# Patient Record
Sex: Female | Born: 1935 | Race: White | Hispanic: No | Smoking: Never smoker
Health system: Southern US, Community
[De-identification: ages and names within clinical notes are randomized; demographics above are authoritative.]

## PROBLEM LIST (undated history)

## (undated) DIAGNOSIS — I10 Essential (primary) hypertension: Secondary | ICD-10-CM

## (undated) DIAGNOSIS — E079 Disorder of thyroid, unspecified: Secondary | ICD-10-CM

## (undated) DIAGNOSIS — E785 Hyperlipidemia, unspecified: Secondary | ICD-10-CM

## (undated) DIAGNOSIS — I509 Heart failure, unspecified: Secondary | ICD-10-CM

## (undated) DIAGNOSIS — I4891 Unspecified atrial fibrillation: Secondary | ICD-10-CM

## (undated) DIAGNOSIS — K7460 Unspecified cirrhosis of liver: Secondary | ICD-10-CM

## (undated) DIAGNOSIS — A310 Pulmonary mycobacterial infection: Secondary | ICD-10-CM

## (undated) DIAGNOSIS — R0989 Other specified symptoms and signs involving the circulatory and respiratory systems: Secondary | ICD-10-CM

## (undated) DIAGNOSIS — J40 Bronchitis, not specified as acute or chronic: Secondary | ICD-10-CM

## (undated) DIAGNOSIS — H3530 Unspecified macular degeneration: Secondary | ICD-10-CM

## (undated) HISTORY — PX: ABDOMINAL HYSTERECTOMY: SHX81

## (undated) HISTORY — PX: CORONARY ARTERY BYPASS GRAFT: SHX141

## (undated) HISTORY — PX: VASCULAR SURGERY: SHX849

## (undated) HISTORY — PX: PACEMAKER INSERTION: SHX728

## (undated) HISTORY — DX: Unspecified macular degeneration: H35.30

## (undated) HISTORY — PX: MITRAL VALVE REPLACEMENT: SHX147

## (undated) HISTORY — DX: Unspecified atrial fibrillation: I48.91

## (undated) HISTORY — PX: TRICUSPID VALVE REPLACEMENT: SHX816

## (undated) HISTORY — PX: CHOLECYSTECTOMY: SHX55

---

## 1967-02-10 HISTORY — PX: CHOLECYSTECTOMY: SHX55

## 1983-02-10 HISTORY — PX: ABDOMINAL HYSTERECTOMY: SHX81

## 1996-02-10 HISTORY — PX: MITRAL VALVE REPLACEMENT: SHX147

## 2008-04-23 DIAGNOSIS — D649 Anemia, unspecified: Secondary | ICD-10-CM

## 2008-04-23 DIAGNOSIS — D6949 Other primary thrombocytopenia: Secondary | ICD-10-CM

## 2008-04-23 HISTORY — DX: Anemia, unspecified: D64.9

## 2008-04-23 HISTORY — DX: Other primary thrombocytopenia: D69.49

## 2008-05-26 DIAGNOSIS — R161 Splenomegaly, not elsewhere classified: Secondary | ICD-10-CM

## 2008-05-26 DIAGNOSIS — Z803 Family history of malignant neoplasm of breast: Secondary | ICD-10-CM

## 2008-05-26 HISTORY — DX: Splenomegaly, not elsewhere classified: R16.1

## 2008-05-26 HISTORY — DX: Family history of malignant neoplasm of breast: Z80.3

## 2009-11-05 DIAGNOSIS — I639 Cerebral infarction, unspecified: Secondary | ICD-10-CM

## 2009-11-05 HISTORY — DX: Cerebral infarction, unspecified: I63.9

## 2010-05-16 DIAGNOSIS — R0600 Dyspnea, unspecified: Secondary | ICD-10-CM | POA: Insufficient documentation

## 2010-05-16 DIAGNOSIS — R0609 Other forms of dyspnea: Secondary | ICD-10-CM

## 2010-05-16 HISTORY — DX: Dyspnea, unspecified: R06.00

## 2010-05-16 HISTORY — DX: Other forms of dyspnea: R06.09

## 2012-07-05 DIAGNOSIS — I519 Heart disease, unspecified: Secondary | ICD-10-CM

## 2012-07-05 HISTORY — DX: Heart disease, unspecified: I51.9

## 2012-11-01 DIAGNOSIS — R059 Cough, unspecified: Secondary | ICD-10-CM

## 2012-11-01 HISTORY — DX: Cough, unspecified: R05.9

## 2013-10-30 DIAGNOSIS — Z7901 Long term (current) use of anticoagulants: Secondary | ICD-10-CM

## 2013-10-30 HISTORY — DX: Long term (current) use of anticoagulants: Z79.01

## 2013-11-09 HISTORY — PX: TRICUSPID VALVE SURGERY: SHX817

## 2013-11-15 HISTORY — PX: MITRAL VALVE REPLACEMENT: SHX147

## 2013-12-18 DIAGNOSIS — I721 Aneurysm of artery of upper extremity: Secondary | ICD-10-CM

## 2013-12-18 HISTORY — DX: Aneurysm of artery of upper extremity: I72.1

## 2017-03-09 ENCOUNTER — Observation Stay (HOSPITAL_BASED_OUTPATIENT_CLINIC_OR_DEPARTMENT_OTHER)
Admission: EM | Admit: 2017-03-09 | Discharge: 2017-03-10 | Disposition: A | Discharge status: Home / Self Care | Payer: Medicare Other | Attending: Family Medicine | Admitting: Family Medicine

## 2017-03-09 ENCOUNTER — Encounter (HOSPITAL_BASED_OUTPATIENT_CLINIC_OR_DEPARTMENT_OTHER): Payer: Self-pay | Admitting: *Deleted

## 2017-03-09 ENCOUNTER — Other Ambulatory Visit: Payer: Self-pay

## 2017-03-09 DIAGNOSIS — I482 Chronic atrial fibrillation, unspecified: Secondary | ICD-10-CM | POA: Diagnosis present

## 2017-03-09 DIAGNOSIS — E785 Hyperlipidemia, unspecified: Secondary | ICD-10-CM

## 2017-03-09 DIAGNOSIS — Z950 Presence of cardiac pacemaker: Secondary | ICD-10-CM | POA: Insufficient documentation

## 2017-03-09 DIAGNOSIS — Z79899 Other long term (current) drug therapy: Secondary | ICD-10-CM | POA: Insufficient documentation

## 2017-03-09 DIAGNOSIS — K625 Hemorrhage of anus and rectum: Principal | ICD-10-CM

## 2017-03-09 DIAGNOSIS — I10 Essential (primary) hypertension: Secondary | ICD-10-CM | POA: Diagnosis present

## 2017-03-09 DIAGNOSIS — N183 Chronic kidney disease, stage 3 (moderate): Secondary | ICD-10-CM | POA: Diagnosis not present

## 2017-03-09 DIAGNOSIS — I130 Hypertensive heart and chronic kidney disease with heart failure and stage 1 through stage 4 chronic kidney disease, or unspecified chronic kidney disease: Secondary | ICD-10-CM | POA: Insufficient documentation

## 2017-03-09 DIAGNOSIS — Z952 Presence of prosthetic heart valve: Secondary | ICD-10-CM | POA: Insufficient documentation

## 2017-03-09 DIAGNOSIS — D696 Thrombocytopenia, unspecified: Secondary | ICD-10-CM | POA: Diagnosis not present

## 2017-03-09 DIAGNOSIS — E039 Hypothyroidism, unspecified: Secondary | ICD-10-CM

## 2017-03-09 DIAGNOSIS — K644 Residual hemorrhoidal skin tags: Secondary | ICD-10-CM | POA: Diagnosis not present

## 2017-03-09 DIAGNOSIS — I509 Heart failure, unspecified: Secondary | ICD-10-CM | POA: Insufficient documentation

## 2017-03-09 DIAGNOSIS — I2510 Atherosclerotic heart disease of native coronary artery without angina pectoris: Secondary | ICD-10-CM | POA: Insufficient documentation

## 2017-03-09 DIAGNOSIS — Z7901 Long term (current) use of anticoagulants: Secondary | ICD-10-CM | POA: Insufficient documentation

## 2017-03-09 DIAGNOSIS — Z951 Presence of aortocoronary bypass graft: Secondary | ICD-10-CM | POA: Diagnosis not present

## 2017-03-09 DIAGNOSIS — K7460 Unspecified cirrhosis of liver: Secondary | ICD-10-CM | POA: Diagnosis not present

## 2017-03-09 DIAGNOSIS — D62 Acute posthemorrhagic anemia: Secondary | ICD-10-CM | POA: Diagnosis present

## 2017-03-09 DIAGNOSIS — K922 Gastrointestinal hemorrhage, unspecified: Secondary | ICD-10-CM

## 2017-03-09 HISTORY — DX: Heart failure, unspecified: I50.9

## 2017-03-09 HISTORY — DX: Essential (primary) hypertension: I10

## 2017-03-09 HISTORY — DX: Hyperlipidemia, unspecified: E78.5

## 2017-03-09 HISTORY — DX: Gastrointestinal hemorrhage, unspecified: K92.2

## 2017-03-09 HISTORY — DX: Disorder of thyroid, unspecified: E07.9

## 2017-03-09 HISTORY — DX: Other specified symptoms and signs involving the circulatory and respiratory systems: R09.89

## 2017-03-09 HISTORY — DX: Unspecified cirrhosis of liver: K74.60

## 2017-03-09 HISTORY — DX: Chronic atrial fibrillation, unspecified: I48.20

## 2017-03-09 HISTORY — DX: Acute posthemorrhagic anemia: D62

## 2017-03-09 HISTORY — DX: Bronchitis, not specified as acute or chronic: J40

## 2017-03-09 HISTORY — DX: Pulmonary mycobacterial infection: A31.0

## 2017-03-09 HISTORY — DX: Hemorrhage of anus and rectum: K62.5

## 2017-03-09 HISTORY — DX: Hypothyroidism, unspecified: E03.9

## 2017-03-09 LAB — CBC
HEMATOCRIT: 31.6 % — AB (ref 36.0–46.0)
HEMOGLOBIN: 10.3 g/dL — AB (ref 12.0–15.0)
MCH: 30.2 pg (ref 26.0–34.0)
MCHC: 32.6 g/dL (ref 30.0–36.0)
MCV: 92.7 fL (ref 78.0–100.0)
PLATELETS: 81 10*3/uL — AB (ref 150–400)
RBC: 3.41 MIL/uL — AB (ref 3.87–5.11)
RDW: 13.5 % (ref 11.5–15.5)
WBC: 3.7 10*3/uL — AB (ref 4.0–10.5)

## 2017-03-09 LAB — BASIC METABOLIC PANEL
ANION GAP: 7 (ref 5–15)
BUN: 41 mg/dL — AB (ref 6–20)
CALCIUM: 9.3 mg/dL (ref 8.9–10.3)
CO2: 23 mmol/L (ref 22–32)
CREATININE: 1.88 mg/dL — AB (ref 0.44–1.00)
Chloride: 105 mmol/L (ref 101–111)
GFR calc Af Amer: 28 mL/min — ABNORMAL LOW (ref >60)
GFR, EST NON AFRICAN AMERICAN: 24 mL/min — AB (ref >60)
GLUCOSE: 98 mg/dL (ref 65–99)
Potassium: 5.4 mmol/L — ABNORMAL HIGH (ref 3.5–5.1)
Sodium: 135 mmol/L (ref 135–145)

## 2017-03-09 LAB — PROTIME-INR
INR: 1.74
Prothrombin Time: 20.2 seconds — ABNORMAL HIGH (ref 11.4–15.2)

## 2017-03-09 LAB — CBC WITH DIFFERENTIAL/PLATELET
BASOS PCT: 1 %
Basophils Absolute: 0.1 10*3/uL (ref 0.0–0.1)
Eosinophils Absolute: 0.2 10*3/uL (ref 0.0–0.7)
Eosinophils Relative: 3 %
HEMATOCRIT: 34.1 % — AB (ref 36.0–46.0)
HEMOGLOBIN: 10.9 g/dL — AB (ref 12.0–15.0)
LYMPHS PCT: 14 %
Lymphs Abs: 0.7 10*3/uL (ref 0.7–4.0)
MCH: 30.2 pg (ref 26.0–34.0)
MCHC: 32 g/dL (ref 30.0–36.0)
MCV: 94.5 fL (ref 78.0–100.0)
Monocytes Absolute: 0.7 10*3/uL (ref 0.1–1.0)
Monocytes Relative: 13 %
Neutro Abs: 3.4 10*3/uL (ref 1.7–7.7)
Neutrophils Relative %: 69 %
Platelets: 97 10*3/uL — ABNORMAL LOW (ref 150–400)
RBC: 3.61 MIL/uL — AB (ref 3.87–5.11)
RDW: 13.3 % (ref 11.5–15.5)
WBC: 5.1 10*3/uL (ref 4.0–10.5)

## 2017-03-09 LAB — TYPE AND SCREEN
ABO/RH(D): O NEG
Antibody Screen: NEGATIVE

## 2017-03-09 LAB — ABO/RH: ABO/RH(D): O NEG

## 2017-03-09 MED ORDER — CARVEDILOL 3.125 MG PO TABS: 3.1250 mg | ORAL_TABLET | Freq: Two times a day (BID) | ORAL | Status: DC

## 2017-03-09 MED ORDER — ONDANSETRON HCL 4 MG/2ML IJ SOLN: 4.0000 mg | Freq: Four times a day (QID) | INTRAMUSCULAR | Status: DC | PRN

## 2017-03-09 MED ORDER — ALBUTEROL SULFATE (2.5 MG/3ML) 0.083% IN NEBU: 2.5000 mg | INHALATION_SOLUTION | Freq: Four times a day (QID) | RESPIRATORY_TRACT | Status: DC | PRN

## 2017-03-09 MED ORDER — LEVOTHYROXINE SODIUM 100 MCG PO TABS
200.0000 ug | ORAL_TABLET | Freq: Every day | ORAL | Status: DC
  Administered 2017-03-10: 200 ug via ORAL
  Filled 2017-03-09: qty 2

## 2017-03-09 MED ORDER — IRBESARTAN 150 MG PO TABS: 150.0000 mg | ORAL_TABLET | Freq: Every day | ORAL | Status: DC

## 2017-03-09 MED ORDER — CARVEDILOL 3.125 MG PO TABS
3.1250 mg | ORAL_TABLET | Freq: Two times a day (BID) | ORAL | Status: DC
  Administered 2017-03-09 – 2017-03-10 (×2): 3.125 mg via ORAL
  Filled 2017-03-09 (×2): qty 1

## 2017-03-09 MED ORDER — ALBUTEROL SULFATE HFA 108 (90 BASE) MCG/ACT IN AERS: 1.0000 | INHALATION_SPRAY | Freq: Four times a day (QID) | RESPIRATORY_TRACT | Status: DC | PRN

## 2017-03-09 MED ORDER — ONDANSETRON HCL 4 MG PO TABS: 4.0000 mg | ORAL_TABLET | Freq: Four times a day (QID) | ORAL | Status: DC | PRN

## 2017-03-09 MED ORDER — EZETIMIBE 10 MG PO TABS
10.0000 mg | ORAL_TABLET | Freq: Every day | ORAL | Status: DC
  Administered 2017-03-09 – 2017-03-10 (×2): 10 mg via ORAL
  Filled 2017-03-09 (×2): qty 1

## 2017-03-09 MED ORDER — GEMFIBROZIL 600 MG PO TABS
600.0000 mg | ORAL_TABLET | Freq: Every day | ORAL | Status: DC
  Administered 2017-03-09: 600 mg via ORAL
  Filled 2017-03-09: qty 1

## 2017-03-09 MED ORDER — SODIUM CHLORIDE 0.9 % IV SOLN
INTRAVENOUS | Status: DC
  Administered 2017-03-09: via INTRAVENOUS

## 2017-03-09 MED ORDER — ACETAMINOPHEN 325 MG PO TABS: 650.0000 mg | ORAL_TABLET | Freq: Four times a day (QID) | ORAL | Status: DC | PRN

## 2017-03-09 MED ORDER — VITAMIN B-12 1000 MCG PO TABS
1000.0000 ug | ORAL_TABLET | Freq: Every day | ORAL | Status: DC
  Administered 2017-03-10: 1000 ug via ORAL
  Filled 2017-03-09: qty 1

## 2017-03-09 MED ORDER — GEMFIBROZIL 600 MG PO TABS: 600.0000 mg | ORAL_TABLET | Freq: Every day | ORAL | Status: DC

## 2017-03-09 MED ORDER — EZETIMIBE 10 MG PO TABS: 10.0000 mg | ORAL_TABLET | Freq: Every day | ORAL | Status: DC

## 2017-03-09 MED ORDER — TRAZODONE HCL 100 MG PO TABS
100.0000 mg | ORAL_TABLET | Freq: Every day | ORAL | Status: DC
  Administered 2017-03-09: 100 mg via ORAL
  Filled 2017-03-09: qty 1

## 2017-03-09 MED ORDER — ACETAMINOPHEN 650 MG RE SUPP: 650.0000 mg | Freq: Four times a day (QID) | RECTAL | Status: DC | PRN

## 2017-03-09 MED ORDER — IRBESARTAN 150 MG PO TABS
150.0000 mg | ORAL_TABLET | Freq: Every day | ORAL | Status: DC
  Administered 2017-03-09 – 2017-03-10 (×2): 150 mg via ORAL
  Filled 2017-03-09 (×2): qty 1

## 2017-03-09 NOTE — ED Notes (Signed)
Report attempted x2 to [phone] and x1 to [phone] with no answer

## 2017-03-09 NOTE — Progress Notes (Signed)
Dr. Hal Hope up to see pt at this time and verbally ordered NS @50mL /hr and a STAT CBC. This RN placed above orders.

## 2017-03-09 NOTE — ED Triage Notes (Signed)
Bright red rectal bleeding sudden onset. She takes Coumadin.

## 2017-03-09 NOTE — ED Provider Notes (Addendum)
[facility] EMERGENCY DEPARTMENT Provider Note   CSN: [id number] Arrival date & time: 03/09/17  1205     History   Chief Complaint Chief Complaint  Patient presents with  . Rectal Bleeding    HPI Annette Powers is a 82 y.o. female.  Patient is a 82 year old female who presents with rectal bleeding.  She has a history of CHF, hyperlipidemia, hypertension and atrial fibrillation.  She is on Coumadin.  She has had prior valve replacements of the mitral valve and tricuspid valve.  She has a pacemaker in place.  She states that this morning she was going to the bathroom and when she stood up she had blood all over the floor.  She noticed blood in the toilet.  She has blood when she wipes her bottom.  She has no known history of hemorrhoids.  No rectal pain.  No abdominal pain.  No nausea or vomiting.  No dizziness.  No history of GI bleeding in the past.  She is currently visiting from out of town.  She came straight here following the bleeding.      Past Medical History:  Diagnosis Date  . Abdominal bruit   . Bronchitis   . CHF (congestive heart failure) ([place])   . Hyperlipidemia   . Hypertension   . Liver cirrhosis ([place])   . MAI (mycobacterium avium-intracellulare) ([place])   . Thyroid disease     There are no active problems to display for this patient.   Past Surgical History:  Procedure Laterality Date  . ABDOMINAL HYSTERECTOMY    . CHOLECYSTECTOMY    . MITRAL VALVE REPLACEMENT    . PACEMAKER INSERTION    . TRICUSPID VALVE REPLACEMENT      OB History    No data available       Home Medications    Prior to Admission medications   Medication Sig Start Date End Date Taking? Authorizing Provider  ALBUTEROL IN Inhale into the lungs.   Yes [provider]  Carvedilol (COREG PO) Take by mouth.   Yes [provider]  Cholestyramine (QUESTRAN PO) Take by mouth.   Yes [provider]  ezetimibe (ZETIA) 10 MG tablet Take 10 mg by  mouth daily.   Yes [provider]  Furosemide (LASIX PO) Take by mouth.   Yes [provider]  gemfibrozil (LOPID) 600 MG tablet Take 600 mg by mouth 2 (two) times daily before a meal.   Yes [provider]  Irbesartan (AVAPRO PO) Take by mouth.   Yes [provider]  Levothyroxine Sodium (SYNTHROID PO) Take by mouth.   Yes [provider]  Spironolactone (ALDACTONE PO) Take by mouth.   Yes [provider]  traZODone (DESYREL) 100 MG tablet Take 100 mg by mouth at bedtime.   Yes [provider]  warfarin (COUMADIN) 5 MG tablet Take 5 mg by mouth daily.   Yes [provider]    Family History No family history on file.  Social History Social History   Tobacco Use  . Smoking status: Never Smoker  . Smokeless tobacco: Never Used  Substance Use Topics  . Alcohol use: Yes    Frequency: Never  . Drug use: No     Allergies   Patient has no known allergies.   Review of Systems Review of Systems  Constitutional: Negative for chills, diaphoresis, fatigue and fever.  HENT: Negative for congestion, rhinorrhea and sneezing.   Eyes: Negative.   Respiratory: Negative for cough,  chest tightness and shortness of breath.   Cardiovascular: Negative for chest pain and leg swelling.  Gastrointestinal: Positive for blood in stool. Negative for abdominal pain, diarrhea, nausea and vomiting.  Genitourinary: Negative for difficulty urinating, flank pain, frequency and hematuria.  Musculoskeletal: Negative for arthralgias and back pain.  Skin: Negative for rash.  Neurological: Negative for dizziness, speech difficulty, weakness, numbness and headaches.     Physical Exam Updated Vital Signs BP 119/60 (BP Location: Left Arm)   Pulse 61   Temp 97.8 F (36.6 C) (Oral)   Resp 16   Ht 5\' 7"  (1.702 m)   Wt 83.9 kg (185 lb)   SpO2 100%   BMI 28.98 kg/m   Physical Exam  Constitutional: She is oriented to person, place,  and time. She appears well-developed and well-nourished.  HENT:  Head: Normocephalic and atraumatic.  Eyes: Pupils are equal, round, and reactive to light.  Neck: Normal range of motion. Neck supple.  Cardiovascular: Normal rate, regular rhythm and normal heart sounds.  Pulmonary/Chest: Effort normal and breath sounds normal. No respiratory distress. She has no wheezes. She has no rales. She exhibits no tenderness.  Abdominal: Soft. Bowel sounds are normal. There is no tenderness. There is no rebound and no guarding.  Genitourinary:  Genitourinary Comments: Positive bright red blood per rectum.  There are some nonthrombosed hemorrhoids externally.  I do not see any that are actively bleeding.  There is some ecchymosis on 1 of the hemorrhoids.  Musculoskeletal: Normal range of motion. She exhibits no edema.  Lymphadenopathy:    She has no cervical adenopathy.  Neurological: She is alert and oriented to person, place, and time.  Skin: Skin is warm and dry. No rash noted.  Psychiatric: She has a normal mood and affect.     ED Treatments / Results  Labs (all labs ordered are listed, but only abnormal results are displayed) Labs Reviewed  BASIC METABOLIC PANEL - Abnormal; Notable for the following components:      Result Value   Potassium 5.4 (*)    BUN 41 (*)    Creatinine, Ser 1.88 (*)    GFR calc non Af Amer 24 (*)    GFR calc Af Amer 28 (*)    All other components within normal limits  CBC WITH DIFFERENTIAL/PLATELET - Abnormal; Notable for the following components:   RBC 3.61 (*)    Hemoglobin 10.9 (*)    HCT 34.1 (*)    Platelets 97 (*)    All other components within normal limits  PROTIME-INR - Abnormal; Notable for the following components:   Prothrombin Time 20.2 (*)    All other components within normal limits    EKG  EKG Interpretation  Date/Time:  Tuesday March 09 2017 14:53:35 EST Ventricular Rate:  60 PR Interval:    QRS Duration: 168 QT Interval:  461 QTC  Calculation: 461 R Axis:   -65 Text Interpretation:  Junctional rhythm Left bundle branch block Baseline wander in lead(s) V3 No old tracing to compare Confirmed by Malvin Johns [phone]) on 03/09/2017 2:57:02 PM       Radiology No results found.  Procedures Procedures (including critical care time)  Medications Ordered in ED Medications - No data to display   Initial Impression / Assessment and Plan / ED Course  I have reviewed the triage vital signs and the nursing notes.  Pertinent labs & imaging results that were available during my care of the patient were reviewed by me and considered  in my medical decision making (see chart for details).     Patient is a 82 year old female on Coumadin who presents with rectal bleeding.  She has no ongoing bleeding currently.  Her rectal exam did show positive bright red blood.  Her hemoglobin is 10.9 but I do not know what her baseline value is.  She does say that she has chronic thrombocytopenia.  She has a mild elevation in her creatinine.  Her INR is subtherapeutic.  I spoke with Dr.Gherghe who has accepted the patient to [facility].  Dr. Cruzita Lederer states that he will put a bed request in.  Final Clinical Impressions(s) / ED Diagnoses   Final diagnoses:  Rectal bleeding    ED Discharge Orders    None       Malvin Johns, MD 03/09/17 1512    Malvin Johns, MD 03/09/17 [phone]

## 2017-03-09 NOTE — H&P (Signed)
History and Physical    Annette Powers TIW:[id number] DOB: 1935-08-26 DOA: 03/09/2017  PCP: Patient, No Pcp Per  Patient coming from: Home.  Chief Complaint: Rectal bleeding.  HPI: Annette Powers is a 82 y.o. female with history of atrial fibrillation on Coumadin, CAD status post CABG, history of mitral valve and aortic valve status post bioprosthetic valve replacement, history of liver disease and chronic kidney disease, hypothyroidism who is visiting [place] from [place] started experiencing rectal bleeding while she was at the [facility].  Patient states she had a bowel movement and found that the commode was filled with frank rectal bleeding.  Denies any abdominal pain.  Denies any nausea vomiting.  Patient takes Coumadin for atrial fibrillation.  ED Course: In the ER patient is found to be hemodynamically stable.  Hemoglobin is around 10.  Patient states since her first episode she had couple more episodes which were frankly bloody.  Patient will be admitted for further observation.  Patient states she had a colonoscopy done 2 years ago which was unremarkable.  Review of Systems: As per HPI, rest all negative.   Past Medical History:  Diagnosis Date  . Abdominal bruit   . Bronchitis   . CHF (congestive heart failure) ([place])   . Hyperlipidemia   . Hypertension   . Liver cirrhosis ([place])   . MAI (mycobacterium avium-intracellulare) ([place])   . Thyroid disease     Past Surgical History:  Procedure Laterality Date  . ABDOMINAL HYSTERECTOMY    . CHOLECYSTECTOMY    . MITRAL VALVE REPLACEMENT    . PACEMAKER INSERTION    . TRICUSPID VALVE REPLACEMENT       reports that  has never smoked. she has never used smokeless tobacco. She reports that she drinks alcohol. She reports that she does not use drugs.  No Known Allergies  Family History - Mother Breast Cancer.  Prior to Admission medications   Medication Sig Start Date End Date Taking? Authorizing Provider  albuterol  (PROVENTIL HFA;VENTOLIN HFA) 108 (90 Base) MCG/ACT inhaler Inhale 1 puff into the lungs every 6 (six) hours as needed for wheezing or shortness of breath.   Yes [provider]  carvedilol (COREG) 3.125 MG tablet Take 3.125 mg by mouth 2 (two) times daily with a meal.    Yes [provider]  Cholecalciferol 5000 units TABS Take 5,000 Units by mouth daily.   Yes [provider]  cholestyramine Lucrezia Starch) 4 GM/DOSE powder Take 4 g by mouth as needed.    Yes [provider]  ezetimibe (ZETIA) 10 MG tablet Take 10 mg by mouth daily.   Yes [provider]  furosemide (LASIX) 20 MG tablet Take 20 mg by mouth daily.    Yes [provider]  gemfibrozil (LOPID) 600 MG tablet Take 600 mg by mouth at bedtime.    Yes [provider]  irbesartan (AVAPRO) 150 MG tablet Take 150 mg by mouth.    Yes [provider]  levothyroxine (SYNTHROID) 200 MCG tablet Take 200 mcg by mouth daily.    Yes [provider]  spironolactone (ALDACTONE) 50 MG tablet Take 50 mg by mouth daily.    Yes [provider]  traZODone (DESYREL) 100 MG tablet Take 100 mg by mouth at bedtime.   Yes [provider]  vitamin B-12 (CYANOCOBALAMIN) 1000 MCG tablet Take 1,000 mcg by mouth daily.   Yes [provider]  warfarin (COUMADIN) 5 MG tablet Take 5 mg by mouth daily.  Yes [provider]    Physical Exam: Vitals:   03/09/17 1503 03/09/17 1530 03/09/17 1600 03/09/17 1700  BP: 119/60 (!) 118/50 (!) 130/46 (!) 120/49  Pulse: 61 60 63 (!) 59  Resp: 16 18 15    Temp:      TempSrc:      SpO2: 100% 99% 97% 96%  Weight:      Height:          Constitutional: Moderately built and nourished. Vitals:   03/09/17 1503 03/09/17 1530 03/09/17 1600 03/09/17 1700  BP: 119/60 (!) 118/50 (!) 130/46 (!) 120/49  Pulse: 61 60 63 (!) 59  Resp: 16 18 15    Temp:      TempSrc:      SpO2: 100% 99% 97% 96%  Weight:      Height:         Eyes: Anicteric no pallor. ENMT: No discharge from the ears eyes nose or mouth. Neck: No mass felt.  No neck rigidity. Respiratory: No rhonchi or crepitations. Cardiovascular: S1-S2 heard no murmurs appreciated. Abdomen: Soft nontender bowel sounds present. Musculoskeletal: No edema.  No joint effusion. Skin: No rash.  Skin appears warm. Neurologic: Alert awake oriented to time place and person.  Moves all extremities. Psychiatric: Appears normal.  Normal affect.   Labs on Admission: I have personally reviewed following labs and imaging studies  CBC: Recent Labs  Lab 03/09/17 1226 03/09/17 2046  WBC 5.1 3.7*  NEUTROABS 3.4  --   HGB 10.9* 10.3*  HCT 34.1* 31.6*  MCV 94.5 92.7  PLT 97* 81*   Basic Metabolic Panel: Recent Labs  Lab 03/09/17 1226  NA 135  K 5.4*  CL 105  CO2 23  GLUCOSE 98  BUN 41*  CREATININE 1.88*  CALCIUM 9.3   GFR: Estimated Creatinine Clearance: 27 mL/min (A) (by C-G formula based on SCr of 1.88 mg/dL (H)). Liver Function Tests: No results for input(s): AST, ALT, ALKPHOS, BILITOT, PROT, ALBUMIN in the last 168 hours. No results for input(s): LIPASE, AMYLASE in the last 168 hours. No results for input(s): AMMONIA in the last 168 hours. Coagulation Profile: Recent Labs  Lab 03/09/17 1226  INR 1.74   Cardiac Enzymes: No results for input(s): CKTOTAL, CKMB, CKMBINDEX, TROPONINI in the last 168 hours. BNP (last 3 results) No results for input(s): PROBNP in the last 8760 hours. HbA1C: No results for input(s): HGBA1C in the last 72 hours. CBG: No results for input(s): GLUCAP in the last 168 hours. Lipid Profile: No results for input(s): CHOL, HDL, LDLCALC, TRIG, CHOLHDL, LDLDIRECT in the last 72 hours. Thyroid Function Tests: No results for input(s): TSH, T4TOTAL, FREET4, T3FREE, THYROIDAB in the last 72 hours. Anemia Panel: No results for input(s): VITAMINB12, FOLATE, FERRITIN, TIBC, IRON, RETICCTPCT in the last 72 hours. Urine  analysis: No results found for: COLORURINE, APPEARANCEUR, LABSPEC, PHURINE, GLUCOSEU, HGBUR, BILIRUBINUR, KETONESUR, PROTEINUR, UROBILINOGEN, NITRITE, LEUKOCYTESUR Sepsis Labs: @LABRCNTIP (procalcitonin:4,lacticidven:4) )No results found for this or any previous visit (from the past 240 hour(s)).   Radiological Exams on Admission: No results found.   Assessment/Plan Principal Problem:   Rectal bleeding Active Problems:   Atrial fibrillation, chronic (HCC)   Essential hypertension   HLD (hyperlipidemia)   Acute blood loss anemia   Hypothyroidism    1. Rectal bleeding -could be from diverticular disease.  Patient states she had a colonoscopy in [place] 2 years ago which was unremarkable.  For now will hold Coumadin patient agrees to holding Coumadin.  Follow CBC.  Will  keep patient n.p.o and gently hydrate. 2. History of atrial fibrillation presently rate controlled.  On Coreg.  Holding Coumadin due to GI bleed. 3. History of CAD status post CABG -denies any chest pain.  Patient is on Lopid Zetia and Coreg.  Holding Coumadin due to GI bleed. 4. Anemia could be from blood loss or from chronic kidney disease -follow CBC. 5. History of chronic kidney disease stage III -no old labs to compare follow metabolic panel.  Patient's potassium is slightly elevated.  May have to hold ARB if still elevated. 6. Hypertension on Coreg and ARB.  See #5 is regarding to ARB. 7. Hyperlipidemia on Lopid and Zetia. 8. Hypothyroidism on Synthroid. 9. History of bioprosthetic valve replacement for mitral valve and aortic valve. 10. History of cirrhosis of liver. 11. History of pacemaker insertion.   DVT prophylaxis: SCDs. Code Status: Full code. Family Communication: Discussed with patient. Disposition Plan: Home. Consults called: None. Admission status: Observation.   Rise Patience MD [facility] Pager [phone].  If 7PM-7AM, please contact  night-coverage [email or web address] Password [email or web address]  03/09/2017, 9:50 PM

## 2017-03-09 NOTE — Progress Notes (Signed)
Called by Dr. Tamera Punt regarding Annette Powers, 82 year old female who is visiting from out of town, with reported history of A. fib on Coumadin, hypertension, hypothyroidism, chronic thrombocytopenia, external hemorrhoids, who presented to the med Center at [facility] with rectal bleed.  Per EDP, rectal bleed is now almost resolved, her hemoglobin is stable, her vital signs are stable and she is not tachycardic nor hypotensive.  Her INR was 1.7.  Her hemoglobin is 10.9.  Creatinine is 1.88, unknown baseline.  Patient accepted to MedSurg, bleed possibly from diverticular source versus hemorrhoids, it appears stable now and given the fact that she is on Coumadin agree with observation at least overnight.  May need to consider GI involvement if hemoglobin continues to further trend down or she is to actively have bleeding while hospitalized.    RN, on patient arrival, please call [phone] and let patient placement RN know of the patient's arrival and a hospitalist will be assigned to admit the patient.    Kinsleigh Ludolph M. Cruzita Lederer, MD [facility] [phone]  If 7PM-7AM, please contact night-coverage [email or web address] Password [facility]  03/20/2016, 5:37 AM

## 2017-03-09 NOTE — ED Notes (Signed)
ED Provider at bedside. 

## 2017-03-10 DIAGNOSIS — K625 Hemorrhage of anus and rectum: Secondary | ICD-10-CM

## 2017-03-10 DIAGNOSIS — E039 Hypothyroidism, unspecified: Secondary | ICD-10-CM

## 2017-03-10 DIAGNOSIS — D62 Acute posthemorrhagic anemia: Secondary | ICD-10-CM

## 2017-03-10 DIAGNOSIS — I10 Essential (primary) hypertension: Secondary | ICD-10-CM

## 2017-03-10 DIAGNOSIS — I482 Chronic atrial fibrillation: Secondary | ICD-10-CM

## 2017-03-10 LAB — CBC
HCT: 29 % — ABNORMAL LOW (ref 36.0–46.0)
HEMATOCRIT: 30.3 % — AB (ref 36.0–46.0)
Hemoglobin: 9.4 g/dL — ABNORMAL LOW (ref 12.0–15.0)
Hemoglobin: 9.8 g/dL — ABNORMAL LOW (ref 12.0–15.0)
MCH: 30.1 pg (ref 26.0–34.0)
MCH: 30.4 pg (ref 26.0–34.0)
MCHC: 32.3 g/dL (ref 30.0–36.0)
MCHC: 32.4 g/dL (ref 30.0–36.0)
MCV: 92.9 fL (ref 78.0–100.0)
MCV: 93.9 fL (ref 78.0–100.0)
Platelets: 79 10*3/uL — ABNORMAL LOW (ref 150–400)
Platelets: 93 10*3/uL — ABNORMAL LOW (ref 150–400)
RBC: 3.09 MIL/uL — ABNORMAL LOW (ref 3.87–5.11)
RBC: 3.26 MIL/uL — ABNORMAL LOW (ref 3.87–5.11)
RDW: 13.6 % (ref 11.5–15.5)
RDW: 13.7 % (ref 11.5–15.5)
WBC: 3.6 10*3/uL — ABNORMAL LOW (ref 4.0–10.5)
WBC: 4.1 10*3/uL (ref 4.0–10.5)

## 2017-03-10 LAB — BASIC METABOLIC PANEL
Anion gap: 7 (ref 5–15)
BUN: 38 mg/dL — AB (ref 6–20)
CHLORIDE: 108 mmol/L (ref 101–111)
CO2: 22 mmol/L (ref 22–32)
Calcium: 9 mg/dL (ref 8.9–10.3)
Creatinine, Ser: 1.88 mg/dL — ABNORMAL HIGH (ref 0.44–1.00)
GFR calc Af Amer: 28 mL/min — ABNORMAL LOW (ref >60)
GFR calc non Af Amer: 24 mL/min — ABNORMAL LOW (ref >60)
GLUCOSE: 89 mg/dL (ref 65–99)
POTASSIUM: 4.6 mmol/L (ref 3.5–5.1)
Sodium: 137 mmol/L (ref 135–145)

## 2017-03-10 LAB — PROTIME-INR
INR: 1.9
Prothrombin Time: 21.7 seconds — ABNORMAL HIGH (ref 11.4–15.2)

## 2017-03-10 LAB — GLUCOSE, CAPILLARY: Glucose-Capillary: 82 mg/dL (ref 65–99)

## 2017-03-10 MED ORDER — PHYTONADIONE 5 MG PO TABS
5.0000 mg | ORAL_TABLET | Freq: Once | ORAL | Status: AC
  Administered 2017-03-10: 5 mg via ORAL
  Filled 2017-03-10: qty 1

## 2017-03-10 NOTE — Discharge Summary (Signed)
Physician Discharge Summary  Annette Powers [id number]:[id number] DOB: Nov 05, 1935 DOA: 03/09/2017  PCP: Annette Powers  Admit date: 03/09/2017 Discharge date: 03/10/2017  Admitted From: Home  Disposition:  Home   Recommendations for Outpatient Follow-up:  1. Follow up with PCP in 1-2 weeks 2. 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     1. Hematochezia Patient is visiting from [place], was at the [facility]te [facility] with her son, felt urge to defecate, in the toilet saw large volume bright red blood.  Went to UC and was sent to the ER, initial Hgb 10.9 g/dL, no baseline available.  Overnight, Hgb minimal change, down to 9.4 g/dL.  No further episodes of rectal bleeding.  Heart rate stable. This morning, several brown stools.  Given resolution of episode, stable Hgb, classic diverticular character, recent (2 years ago) normal colonoscopy, endoscopy was deferred to her PCP and hepatologist.     2. Atrial fibrillation, valvular INR trended up this morning.  Vitamin K given 5 mg PO once.  Hold warfarin.           Discharge Diagnoses:  Principal Problem:   Rectal bleeding Active Problems:   Atrial fibrillation, chronic (HCC)   Essential hypertension   HLD (hyperlipidemia)   Acute blood loss anemia   Hypothyroidism    Discharge Instructions  Discharge Instructions    Diet - low sodium heart healthy   Complete by:  As directed    Discharge instructions   Complete by:  As directed    From Dr. Loleta Books: You were admitted for rectal bleeding. Cases like yours are most frequently what we call "diverticular bleeding".  Diverticuli are small  outpouchings in the colon which are harmless in and of themselves, unless they were to bleed (or also get infected, although we aren't concerned about that in your case).  Diverticular bleeds stop by themselves.  There is nothing that can be done to stop or slow them other than stopping your warfarin for a time, until the diverticulum heals, then you should restart your warfarin.  STOP WARFARIN for two weeks. Restart your previous dose of warfarin in 2 weeks, after discussion with your primary care doctor or liver doctors.  You should call them today to schedule a follow up appointment as soon as you get home.  Another bloody bowel movement tonight is possible, but if you have more than one more bloody bowel movement or if you have ANY of the following symptoms: dizziness, weakness, feeling out of breath with exertion, passing out, you should be seen to have your blood level checked.  Your hemoglobin (blood level) was 10.9 g/dL when you got here and 9.4 g/dL when you left.  You should take iron sulfate 325 mg every other day for the next two weeks, and continue if your primary care doctor recommends.  Bring this paperwork to your primary care.   Increase activity slowly   Complete by:  As directed      Allergies as of 03/10/2017   No Known Allergies     Medication List    STOP taking these medications   warfarin 5 MG tablet Commonly known as:  COUMADIN     TAKE these medications   albuterol 108 (90 Base) MCG/ACT inhaler Commonly known as:  PROVENTIL HFA;VENTOLIN HFA Inhale 1 puff into the lungs every 6 (six) hours as needed for wheezing or shortness of breath.   ALDACTONE 50 MG tablet Generic drug:  spironolactone Take 50 mg by mouth daily.   AVAPRO 150 MG tablet Generic drug:  irbesartan Take 150 mg by mouth.   Cholecalciferol 5000 units Tabs Take 5,000 Units by mouth daily.   COREG 3.125 MG tablet Generic drug:  carvedilol Take 3.125 mg by mouth 2 (two) times daily with a  meal.   ezetimibe 10 MG tablet Commonly known as:  ZETIA Take 10 mg by mouth daily.   gemfibrozil 600 MG tablet Commonly known as:  LOPID Take 600 mg by mouth at bedtime.   LASIX 20 MG tablet Generic drug:  furosemide Take 20 mg by mouth daily.   QUESTRAN 4 GM/DOSE powder Generic drug:  cholestyramine Take 4 g by mouth as needed.   SYNTHROID 200 MCG tablet Generic drug:  levothyroxine Take 200 mcg by mouth daily.   traZODone 100 MG tablet Commonly known as:  DESYREL Take 100 mg by mouth at bedtime.   vitamin B-12 1000 MCG tablet Commonly known as:  CYANOCOBALAMIN Take 1,000 mcg by mouth daily.       No Known Allergies  Consultations:  None   Procedures/Studies:  No results found.    Subjective: Feels well. Several soft non bloody stools this AM.  No melena, no dizziness, no weakness, no confusion, no SOB.  Discharge Exam: Vitals:   03/10/17 0257 03/10/17 0512  BP: (!) 100/54 (!) 90/53  Pulse:  86  Resp:  17  Temp:  98 F (36.7 C)  SpO2:  97%   Vitals:   03/10/17 0153 03/10/17 0257 03/10/17 0500 03/10/17 0512  BP: (!) 90/40 (!) 100/54  (!) 90/53  Pulse:    86  Resp:    17  Temp:    98 F (36.7 C)  TempSrc:    Oral  SpO2:    97%  Weight:   83 kg (182 lb 15.7 oz)   Height:        General: Pt is alert, awake, not in acute distress Cardiovascular: RRR, S1/S2 +, no rubs, no gallops Respiratory: CTA bilaterally, no wheezing, no rhonchi Abdominal: Soft, NT, ND, bowel sounds + Extremities: no edema, no cyanosis    The results of significant diagnostics from this hospitalization (including imaging, microbiology, ancillary and laboratory) are listed below for reference.     Microbiology: No results found for this or any previous visit (from the past 240 hour(s)).   Labs: BNP (last 3 results) No results for input(s): BNP in the last 8760 hours. Basic Metabolic Panel: Recent Labs  Lab 03/09/17 1226 03/10/17 0405  NA 135 137  K 5.4* 4.6   CL 105 108  CO2 23 22  GLUCOSE 98 89  BUN 41* 38*  CREATININE 1.88* 1.88*  CALCIUM 9.3 9.0   Liver Function Tests: No results for input(s): AST, ALT, ALKPHOS, BILITOT, PROT, ALBUMIN in the last 168 hours. No results for input(s): LIPASE, AMYLASE in the last 168 hours. No results for input(s): AMMONIA in the last 168 hours. CBC: Recent Labs  Lab 03/09/17 1226 03/09/17 2046 03/10/17 0012 03/10/17 0405  WBC 5.1 3.7* 4.1 3.6*  NEUTROABS 3.4  --   --   --   HGB 10.9* 10.3* 9.8* 9.4*  HCT 34.1*  31.6* 30.3* 29.0*  MCV 94.5 92.7 92.9 93.9  PLT 97* 81* 93* 79*   Cardiac Enzymes: No results for input(s): CKTOTAL, CKMB, CKMBINDEX, TROPONINI in the last 168 hours. BNP: Invalid input(s): POCBNP CBG: Recent Labs  Lab 03/10/17 0515  GLUCAP 82   D-Dimer No results for input(s): DDIMER in the last 72 hours. Hgb A1c No results for input(s): HGBA1C in the last 72 hours. Lipid Profile No results for input(s): CHOL, HDL, LDLCALC, TRIG, CHOLHDL, LDLDIRECT in the last 72 hours. Thyroid function studies No results for input(s): TSH, T4TOTAL, T3FREE, THYROIDAB in the last 72 hours.  Invalid input(s): FREET3 Anemia work up No results for input(s): VITAMINB12, FOLATE, FERRITIN, TIBC, IRON, RETICCTPCT in the last 72 hours. Urinalysis No results found for: COLORURINE, APPEARANCEUR, LABSPEC, [place], GLUCOSEU, HGBUR, BILIRUBINUR, KETONESUR, PROTEINUR, UROBILINOGEN, NITRITE, LEUKOCYTESUR Sepsis Labs Invalid input(s): PROCALCITONIN,  WBC,  LACTICIDVEN Microbiology No results found for this or any previous visit (from the past 240 hour(s)).   Time coordinating discharge: Over 30 minutes  SIGNED:   Edwin Dada, MD  [facility] 03/10/2017, 1:25 PM

## 2017-03-10 NOTE — Progress Notes (Signed)
Pt's current manual BP is 90/40, asymptomatic. On call Baltazar Najjar made aware. Will continue to monitor pt closely.

## 2017-03-10 NOTE — Care Management Note (Signed)
Case Management Note  Patient Details  Name: Annette Powers MRN: [id number] Date of Birth: May 22, 1935  Subjective/Objective:81 y/o f admitted w/Rectal bleed. From home.                    Action/Plan:d/c plan home.   Expected Discharge Date:                  Expected Discharge Plan:  Home/Self Care  In-House Referral:     Discharge planning Services  CM Consult  Post Acute Care Choice:    Choice offered to:     DME Arranged:    DME Agency:     HH Arranged:    [place] Agency:     Status of Service:  Completed, signed off  If discussed at [employer] of Stay Meetings, dates discussed:    Additional Comments:  Dessa Phi, RN 03/10/2017, 11:14 AM

## 2017-12-21 DIAGNOSIS — Z5181 Encounter for therapeutic drug level monitoring: Secondary | ICD-10-CM

## 2017-12-21 HISTORY — DX: Encounter for therapeutic drug level monitoring: Z51.81

## 2018-06-07 DIAGNOSIS — C3400 Malignant neoplasm of unspecified main bronchus: Secondary | ICD-10-CM

## 2018-06-07 HISTORY — DX: Malignant neoplasm of unspecified main bronchus: C34.00

## 2018-07-07 HISTORY — PX: PACEMAKER INSERTION: SHX728

## 2020-02-13 ENCOUNTER — Ambulatory Visit: Admitting: Family

## 2020-02-19 ENCOUNTER — Ambulatory Visit (INDEPENDENT_AMBULATORY_CARE_PROVIDER_SITE_OTHER): Payer: Medicare Other | Admitting: Family

## 2020-02-19 ENCOUNTER — Encounter: Payer: Self-pay | Admitting: Family

## 2020-02-19 ENCOUNTER — Other Ambulatory Visit: Payer: Self-pay

## 2020-02-19 VITALS — BP 143/70 | HR 80 | Temp 98.4°F | Resp 16 | Ht 67.0 in | Wt 177.0 lb

## 2020-02-19 DIAGNOSIS — I272 Pulmonary hypertension, unspecified: Secondary | ICD-10-CM

## 2020-02-19 DIAGNOSIS — I4891 Unspecified atrial fibrillation: Secondary | ICD-10-CM

## 2020-02-19 DIAGNOSIS — E559 Vitamin D deficiency, unspecified: Secondary | ICD-10-CM | POA: Insufficient documentation

## 2020-02-19 DIAGNOSIS — Z950 Presence of cardiac pacemaker: Secondary | ICD-10-CM

## 2020-02-19 DIAGNOSIS — A310 Pulmonary mycobacterial infection: Secondary | ICD-10-CM

## 2020-02-19 DIAGNOSIS — I509 Heart failure, unspecified: Secondary | ICD-10-CM

## 2020-02-19 DIAGNOSIS — R251 Tremor, unspecified: Secondary | ICD-10-CM

## 2020-02-19 DIAGNOSIS — N189 Chronic kidney disease, unspecified: Secondary | ICD-10-CM | POA: Insufficient documentation

## 2020-02-19 DIAGNOSIS — K7469 Other cirrhosis of liver: Secondary | ICD-10-CM | POA: Insufficient documentation

## 2020-02-19 DIAGNOSIS — E039 Hypothyroidism, unspecified: Secondary | ICD-10-CM | POA: Diagnosis not present

## 2020-02-19 DIAGNOSIS — H3530 Unspecified macular degeneration: Secondary | ICD-10-CM

## 2020-02-19 DIAGNOSIS — J479 Bronchiectasis, uncomplicated: Secondary | ICD-10-CM

## 2020-02-19 DIAGNOSIS — N183 Chronic kidney disease, stage 3 unspecified: Secondary | ICD-10-CM

## 2020-02-19 DIAGNOSIS — K7460 Unspecified cirrhosis of liver: Secondary | ICD-10-CM | POA: Insufficient documentation

## 2020-02-19 DIAGNOSIS — E785 Hyperlipidemia, unspecified: Secondary | ICD-10-CM

## 2020-02-19 HISTORY — DX: Presence of cardiac pacemaker: Z95.0

## 2020-02-19 HISTORY — DX: Chronic kidney disease, stage 3 unspecified: N18.30

## 2020-02-19 HISTORY — DX: Pulmonary mycobacterial infection: A31.0

## 2020-02-19 HISTORY — DX: Bronchiectasis, uncomplicated: J47.9

## 2020-02-19 HISTORY — DX: Tremor, unspecified: R25.1

## 2020-02-19 HISTORY — DX: Vitamin D deficiency, unspecified: E55.9

## 2020-02-19 HISTORY — DX: Heart failure, unspecified: I50.9

## 2020-02-19 HISTORY — DX: Pulmonary hypertension, unspecified: I27.20

## 2020-02-19 HISTORY — DX: Other cirrhosis of liver: K74.69

## 2020-02-19 LAB — COMPREHENSIVE METABOLIC PANEL
ALT: 12 U/L (ref 0–35)
AST: 16 U/L (ref 0–37)
Albumin: 4 g/dL (ref 3.5–5.2)
Alkaline Phosphatase: 98 U/L (ref 39–117)
BUN: 26 mg/dL — ABNORMAL HIGH (ref 6–23)
CO2: 24 mEq/L (ref 19–32)
Calcium: 8.8 mg/dL (ref 8.4–10.5)
Chloride: 109 mEq/L (ref 96–112)
Creatinine, Ser: 1.51 mg/dL — ABNORMAL HIGH (ref 0.40–1.20)
GFR: 31.92 mL/min — ABNORMAL LOW (ref >60.00)
Glucose, Bld: 88 mg/dL (ref 70–99)
Potassium: 4.6 mEq/L (ref 3.5–5.1)
Sodium: 139 mEq/L (ref 135–145)
Total Bilirubin: 0.4 mg/dL (ref 0.2–1.2)
Total Protein: 6 g/dL (ref 6.0–8.3)

## 2020-02-19 LAB — PROTIME-INR
INR: 3.4 ratio — ABNORMAL HIGH (ref 0.8–1.0)
Prothrombin Time: 38 s — ABNORMAL HIGH (ref 9.6–13.1)

## 2020-02-19 LAB — TSH: TSH: 42.83 u[IU]/mL — ABNORMAL HIGH (ref 0.35–4.50)

## 2020-02-19 MED ORDER — WARFARIN SODIUM 5 MG PO TABS: ORAL_TABLET | ORAL | 3 refills | Status: DC

## 2020-02-19 NOTE — Patient Instructions (Signed)
Please call [facility] to schedule an appointment: [phone]

## 2020-02-19 NOTE — Progress Notes (Addendum)
Subjective:    Patient ID: Annette Powers, female    DOB: May 07, 1935, 85 y.o.   MRN: [id number]  HPI  Patient is an 85 yr old female who presents today to establish care. Her husband recently passed away and she is in the process of relocating from [place] to live here with her son.    Pmhx is significant for the following:  Hypothyroid- maintained on synthroid 200 mcg. Reports that this dose has been relatively stable.   MAI/Bronchiectasis-was following with pulmonology.  She denies any long term antibiotic therapy for MAC.    Liver cirrhosis-(cardiogenic cirrhosis)- was followed by transplant clinic in [place].  HTN- maintained on coreg 3.125, avapro 154m, aldactone  BP Readings from Last 3 Encounters:  02/19/20 (!) 143/70  03/10/17 (!) 108/57   Hyperlipidemia-has declined statin therapy. She is instead maintained on zetia and lopid.  CHF/pulmonary HTN- she is s/p tricuspid valve replacement and MVR.  Reports some increased SOB recently.  She is on coumadin 532m(Tuesday Thursday Sa03-06-2024019mCAD- S/p CABG  CKD stage 3b- previously followed by nephrology.  Vit D deficiency- maintained on vit D 5000 iu once weekly.   Hx of bradycardia- s/p PPM placement.  IBS- has diarrhea, queLucrezia Starchlps.    CVA- reports that this was diagnosed without CT.  She declines statin therapy.   Tremor- on primidone- has never had any brain imaging.  Notes no improvement on primidone.  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 (6 years ago)  Review of Systems    see HPI  Past Medical History:  Diagnosis Date   Abdominal bruit    Atrial fibrillation (HCC)    Bronchitis    CHF (congestive heart failure) (HCC)    Hyperlipidemia    Hypertension    Liver cirrhosis (HCC)    MAI (mycobacterium avium-intracellulare) [place]  Thyroid disease      Social History   Socioeconomic History    Marital status: Widowed    Spouse name: Not on file   Number of children: Not on file   Years of education: Not on file   Highest education level: Not on file  Occupational History   Not on file  Tobacco Use   Smoking status: Never Smoker   Smokeless tobacco: Never Used  Vaping Use   Vaping Use: Never used  Substance and Sexual Activity   Alcohol use: Yes   Drug use: No   Sexual activity: Not Currently  Other Topics Concern   Not on file  Social History Narrative   Husband 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   Social Determinants of Health   Financial Resource Strain: Not on file  Food Insecurity: Not on file  Transportation Needs: Not on file  Physical Activity: Not on file  Stress: Not on file  Social Connections: Not on file  Intimate Partner Violence: Not on file    Past Surgical History:  Procedure Laterality Date   ABDEdisonAFT     MITRAL VALVE REPLACEMENT  11/15/2013   bioprosthetic valve (redo)   MITRAL VALVE REPLACEMENT  1998   PACEMAKER INSERTION  07/07/2018   TRICUSPID VALVE SURGERY  11/2013   repair    Family History  Problem Relation Age of Onset   Breast cancer Mother     No  Known Allergies  Current Outpatient Medications on File Prior to Visit  Medication Sig Dispense Refill   albuterol (PROVENTIL HFA;VENTOLIN HFA) 108 (90 Base) MCG/ACT inhaler Inhale 1 puff into the lungs every 6 (six) hours as needed for wheezing or shortness of breath.     carvedilol (COREG) 3.125 MG tablet Take 3.125 mg by mouth 2 (two) times daily with a meal.      Cholecalciferol 5000 units TABS Take 5,000 Units by mouth daily.     cholestyramine (QUESTRAN) 4 GM/DOSE powder Take 4 g by mouth as needed.      ezetimibe (ZETIA) 10 MG tablet Take 10 mg by mouth daily.     furosemide (LASIX) 20 MG tablet Take 20 mg by mouth daily.      gemfibrozil (LOPID) 600 MG tablet Take  600 mg by mouth at bedtime.      irbesartan (AVAPRO) 150 MG tablet Take 150 mg by mouth.      levothyroxine (SYNTHROID) 200 MCG tablet Take 200 mcg by mouth daily.      primidone (MYSOLINE) 50 MG tablet Take 2 tabs nightly for 1 week then 2.5 tabs nightly for 1 week then 3 tabs nightly for 1 week then 3.5 tabs nightly for 1 week then 4 tabs nightly.  Marland Kitchen     spironolactone (ALDACTONE) 50 MG tablet Take 50 mg by mouth daily.      traZODone (DESYREL) 100 MG tablet Take 100 mg by mouth at bedtime.     vitamin B-12 (CYANOCOBALAMIN) 1000 MCG tablet Take 1,000 mcg by mouth daily.     No current facility-administered medications on file prior to visit.    BP (!) 143/70 (BP Location: Right Arm, Patient Position: Sitting, Cuff Size: Small)    Pulse 80    Temp 98.4 F (36.9 C) (Oral)    Resp 16    Ht _0  (1.702 m)    Wt 177 lb (80.3 kg)    SpO2 98%    BMI 27.72 kg/m    Objective:   Physical Exam Constitutional:      Appearance: Normal appearance. She is well-developed and well-nourished.  HENT:     Head: Normocephalic and atraumatic.     Right Ear: Tympanic membrane and ear canal normal.     Left Ear: Tympanic membrane and ear canal normal.  Cardiovascular:     Rate and Rhythm: Normal rate and regular rhythm.     Heart sounds: Normal heart sounds. No murmur heard.   Pulmonary:     Effort: Pulmonary effort is normal. No respiratory distress.     Breath sounds: Normal breath sounds. No wheezing.  Musculoskeletal:     Cervical back: Neck supple.     Right lower leg: 2+ Edema present.     Left lower leg: 2+ Edema present.  Lymphadenopathy:     Cervical: No cervical adenopathy.  Neurological:     Mental Status: She is alert.     Comments: Bilateral resting hand tremor L>R  Psychiatric:        Mood and Affect: Mood and affect normal.        Behavior: Behavior normal.        Thought Content: Thought content normal.        Judgment: Judgment normal.           Assessment &  Plan:  Hypothyroid-clinically stable on Synthroid 200 mcg once daily.  Will obtain follow-up TSH.  Hypertension-blood pressure stable.  Continue Aldactone 50 mg, Coreg 3.125  mg twice daily and Avapro 150 mg daily.  CHF- patient appears clinically compensated today.  Continue Lasix 20 mg once daily.  Will place referral to cardiology.  Of note, her records stated that she had a history of CABG.  She denies this and states that her only open heart surgeries have been for a valve replacement.  Permanent pacemaker-we will place referral to electrophysiology.  Atrial fibrillation-rate is controlled.  She is currently maintained on Coumadin.  Will obtain PT/INR today.  Further dosing adjustments pending review of INR.  She wishes to continue Coumadin monitoring at our office rather than driving to [facility] for the Coumadin clinic.  Liver cirrhosis-this is felt to be cardiogenic in nature per records.  We will refer her to hepatology in [place].  Tremor- I am concerned about the possibility of Parkinson's.  She is agreeable to meet with neurology.  Referral is placed.  History of CVA-this is per records.  Patient states she has never had any brain imaging done.  We discussed importance of statin therapy for secondary prevention.  She declines statin.  History of MAC/bronchiectasis- currently stable.  Will refer to pulmonology.  IBS-D- has good relief with Questran.  Continue same.  Chronic kidney disease stage IIIb- will refer to local nephrologist.  Hyperlipidemia- currently maintained on Zetia 10 mg and Lopid 600 mg daily.  See discussion above re: statin.  Vitamin D deficiency- continue vitamin D supplement.  We will check follow-up vitamin D level.  Macular degeneration- will refer to Ophthalmology.  68 minutes spent on today's visit. Time was spent reviewing outside records in care everywhere, interviewing/examining patient and coordinating care.  This visit occurred during the  SARS-CoV-2 public health emergency.  Safety protocols were in place, including screening questions prior to the visit, additional usage of staff PPE, and extensive cleaning of exam room while observing appropriate contact time as indicated for disinfecting solutions.

## 2020-02-20 ENCOUNTER — Encounter: Payer: Self-pay | Admitting: Neurology

## 2020-02-20 LAB — LIPID PANEL
Cholesterol: 148 mg/dL (ref 0–200)
HDL: 44.6 mg/dL (ref >39.00)
LDL Cholesterol: 83 mg/dL (ref 0–99)
NonHDL: 103.83
Total CHOL/HDL Ratio: 3
Triglycerides: 103 mg/dL (ref 0.0–149.0)
VLDL: 20.6 mg/dL (ref 0.0–40.0)

## 2020-02-21 ENCOUNTER — Telehealth: Payer: Self-pay | Admitting: Family

## 2020-02-21 MED ORDER — LEVOTHYROXINE SODIUM 25 MCG PO TABS: 25.0000 ug | ORAL_TABLET | Freq: Every day | ORAL | 1 refills | Status: DC

## 2020-02-21 NOTE — Telephone Encounter (Signed)
Please contact pt and advise her as follows:  INR is high.  Please change coumadin dosing as follows:  5mg  once daily except 10mg  on Tuesday and Saturday.    Needs nurse visit in 1  weeks for INR check.   Kidney function is stable.  Synthroid needs to be increased- will increase from 271mcg to 225 mcg once daily. She will need to take the 274mcg tablet + a 25 mcg tablet which I sent to her mail order. Take on empty stomach in the AM, wait 30 minutes to eat or take any other medication. We will repeat TSH at her appointment in February.  Cholesterol is stable/at goal.

## 2020-02-21 NOTE — Addendum Note (Signed)
Addended by: [name] on: 02/21/2020 10:50 PM   Modules accepted: Orders

## 2020-02-22 NOTE — Telephone Encounter (Signed)
Called patient at home and cell # but no answer, lvm for her to call back about her results

## 2020-02-23 LAB — VITAMIN D 1,25 DIHYDROXY
Vitamin D 1, 25 (OH)2 Total: 23 pg/mL (ref 18–72)
Vitamin D2 1, 25 (OH)2: <8 pg/mL
Vitamin D3 1, 25 (OH)2: 23 pg/mL

## 2020-02-23 NOTE — Telephone Encounter (Signed)
Advised patient of results and medication dose changes. She reports she is in [place] and will say there for about one month. She will gp to the coumadin clinic there and have INR check in one week. She will call us with the results.

## 2020-03-12 ENCOUNTER — Telehealth: Payer: Self-pay | Admitting: Family

## 2020-03-12 NOTE — Telephone Encounter (Signed)
[facility]ed [facility] to verify as per provider's last instructions patient needs to take 225 mcg of synthroid, a 200 mcg + a 25 mcg tablet

## 2020-03-12 NOTE — Telephone Encounter (Signed)
Caller Pharmacy: [facility] Call Back @ [phone]  Please advise if the correct doseage for patient's levothyroxine (SYNTHROID) 25 MCG tablet [[id number]] was sent to [facility]   Patient use to take 200MCG.   Is the medication we sent in in addition to patient 234mcg

## 2020-03-28 NOTE — Progress Notes (Signed)
Assessment/Plan:   1.  Tremor  -I agree with prior neurologist that she does have characteristics of dystonia, but she also has characteristics of parkinsonism, which is more mild.  I just want to make sure that the dystonia is not part of a bigger picture.  Because of that, I recommended that we go ahead and schedule a DaTscan.  We discussed what that was.  She is going to think about that.  She wants to talk to her son about it.  She will let me know if she would like to schedule it.    -If primary dystonia, then I agree with the patient that the primidone is not helping and she could not tolerate higher dosages.  I would likely recommend that we would just wean primidone off and refer her to PMR for a trial of Botox.  She and I discussed that in detail.  2.  Hypothyroidism  -Patient's TSH profoundly abnormal.  She is working with nurse practitioner to get that treated.  3.  Pancytopenia  -Likely due to known liver cirrhosis Subjective:   Annette Powers was seen today in the movement disorders clinic for neurologic consultation at the request of [name], NP.  The consultation is for the evaluation of tremor and possible Parkinsons Disease.  Outside records that were made available to me were reviewed including neurology records from [facility] in [place], [place].  Patient was seen by nurse practitioner from neurology in June, 2021 and stated that "I suspect this is either essential tremor or cervicobrachial dystonia."  Notes indicate patient was placed on primidone, 100 mg, with improvement.  She saw the movement d/o physician January 12, 2020 and her primidone was restarted (she reports she was never off of it) and increased over 5 weeks to 200 mg daily.  She states that she wasn't able to get to 4 tablets as she was just to nauseated.  She states that she is taking 3 tablets at night.  She doesn't think that it helpful.  Examination revealed "left-sided postural tremor with some  intermittency and variability but generally mild frequency."  Stated that there was some mild bradykinesia.  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   Tremor: Yes.     How long has it been going on? About 4 years ago.  She associates it with median nerve injury after a central line but the tremor started a few years injury.  She saw pain management without relief.  At rest or with activation?  Activation, esp with writing, pouring (no longer able to eat with the L hand).  Fam hx of tremor?  No.  Located where?  L hand only  Affected by caffeine:  No.  Affected by alcohol:  Doesn't drink enough to know  Affected by stress:  No.  Affected by fatigue:  No.  Spills soup if on spoon:  No. b/c she will put the spoon in the R hand even though she is L handed  Other Specific Symptoms:  Voice: no change Postural symptoms: "little wobbly"  Falls? [place] two times during her moving - fell over packing stuff.  Right before that she missed the concrete step outside and fell and had trouble getting up Bradykinesia symptoms: difficulty getting out of a chair Loss of smell:  No. Loss of taste:  No. Urinary Incontinence:  No. Difficulty Swallowing:  No. Handwriting, micrographia: No. Trouble with ADL's:  No.  Trouble buttoning clothing: No. Memory changes:  No. N/V:  No. (not except with  higher dosages of primidone) Lightheaded:  No.  Syncope: No. Diplopia:  No. Dyskinesia:  No.  Neuroimaging of the brain has not previously been performed.  She states that her PPM is not MRI compatible   ALLERGIES:  No Known Allergies  CURRENT MEDICATIONS:  Current Outpatient Medications  Medication Instructions  . albuterol (PROVENTIL HFA;VENTOLIN HFA) 108 (90 Base) MCG/ACT inhaler 1 puff, Inhalation, Every 6 hours PRN  . carvedilol (COREG) 3.125 mg, Oral, 2 times daily with meals  . Cholecalciferol 5,000 Units, Oral, Daily  . cholestyramine (QUESTRAN) 4 g, Oral, As needed  . ezetimibe (ZETIA) 10 mg, Oral,  Daily  . furosemide (LASIX) 20 mg, Oral, Daily  . gemfibrozil (LOPID) 600 mg, Oral, Daily at bedtime  . irbesartan (AVAPRO) 150 mg, Oral  . levothyroxine (SYNTHROID) 200 mcg, Oral, Daily  . primidone (MYSOLINE) 150 mg, Oral, Daily at bedtime  . spironolactone (ALDACTONE) 50 mg, Oral, Daily  . traZODone (DESYREL) 100 mg, Oral, Daily at bedtime  . vitamin B-12 (CYANOCOBALAMIN) 1,000 mcg, Oral, Daily  . warfarin (COUMADIN) 5 MG tablet 5mg  daily except 10mg  on Tuesday, Thursday and saturday    Objective:   PHYSICAL EXAMINATION:    VITALS:   Vitals:   04/02/20 1322  BP: 118/70  Pulse: 80  SpO2: 98%  Weight: 171 lb (77.6 kg)  Height: 5\' 7"  (1.702 m)    GEN:  The patient appears stated age and is in NAD. HEENT:  Normocephalic, atraumatic.  The mucous membranes are moist. The superficial temporal arteries are without ropiness or tenderness. CV:  RRR Lungs:  CTAB Neck/HEME:  There are no carotid bruits bilaterally.  Neurological examination:  Orientation: The patient is alert and oriented x3.  Cranial nerves: There is good facial symmetry.  Extraocular muscles are intact. The visual fields are full to confrontational testing. The speech is fluent and clear. Soft palate rises symmetrically and there is no tongue deviation. Hearing is intact to conversational tone. Sensation: Sensation is intact to light touch throughout (facial, trunk, extremities). Vibration is intact at the bilateral big toe. There is no extinction with double simultaneous stimulation.  Motor: Strength is 5/5 in the bilateral upper and lower extremities.   Shoulder shrug is equal and symmetric.  There is no pronator drift. Deep tendon reflexes: Deep tendon reflexes are 0-1/4 at the bilateral biceps, triceps, brachioradialis, patella and achilles. Plantar responses are downgoing bilaterally.  Movement examination: Tone: There is mild to moderate increased tone in the left greater than right upper extremity.  It is  difficult to tell if this is all gegenhalten/paratonia Abnormal movements: With distraction, there is definitely some left greater than right rest tremor.  There is no significant degree of postural tremor.  However, she has a very significant difficulty with getting the pen to the paper with attempts at Archimedes spirals or attempts at writing anything with the left hand.  She does fine with the right hand. Coordination:  There is mild decremation with RAM's, seen mostly with finger taps on the left.  Other rapid alternating movements on left and right are good. Gait and Station: The patient has no difficulty arising out of a deep-seated chair without the use of the hands. The patient's stride length is good but she does have overall decreased arm swing bilaterally.   I have reviewed and interpreted the following labs independently   Chemistry      Component Value Date/Time   NA 139 02/19/2020 1104   K 4.6 02/19/2020 1104  CL 109 02/19/2020 1104   CO2 24 02/19/2020 1104   BUN 26 (H) 02/19/2020 1104   CREATININE 1.51 (H) 02/19/2020 1104      Component Value Date/Time   CALCIUM 8.8 02/19/2020 1104   ALKPHOS 98 02/19/2020 1104   AST 16 02/19/2020 1104   ALT 12 02/19/2020 1104   BILITOT 0.4 02/19/2020 1104      Lab Results  Component Value Date   TSH 40.20 (H) 04/01/2020   Lab Results  Component Value Date   WBC 3.6 (L) 03/10/2017   HGB 9.4 (L) 03/10/2017   HCT 29.0 (L) 03/10/2017   MCV 93.9 03/10/2017   PLT 79 (L) 03/10/2017      Total time spent on today's visit was 31minutes, including both face-to-face time and nonface-to-face time.  Time included that spent on review of records (prior notes available to me/labs/imaging if pertinent), discussing treatment and goals, answering patient's questions and coordinating care.  Cc:  [name], NP

## 2020-04-01 ENCOUNTER — Ambulatory Visit (INDEPENDENT_AMBULATORY_CARE_PROVIDER_SITE_OTHER): Payer: Medicare Other | Admitting: Family

## 2020-04-01 ENCOUNTER — Telehealth: Payer: Self-pay | Admitting: Family

## 2020-04-01 ENCOUNTER — Encounter: Payer: Self-pay | Admitting: Family

## 2020-04-01 ENCOUNTER — Other Ambulatory Visit: Payer: Self-pay

## 2020-04-01 VITALS — BP 124/80 | HR 92 | Temp 98.5°F | Resp 16 | Wt 180.0 lb

## 2020-04-01 DIAGNOSIS — E039 Hypothyroidism, unspecified: Secondary | ICD-10-CM

## 2020-04-01 DIAGNOSIS — I482 Chronic atrial fibrillation, unspecified: Secondary | ICD-10-CM | POA: Diagnosis not present

## 2020-04-01 DIAGNOSIS — I509 Heart failure, unspecified: Secondary | ICD-10-CM

## 2020-04-01 LAB — TSH: TSH: 40.2 u[IU]/mL — ABNORMAL HIGH (ref 0.35–4.50)

## 2020-04-01 LAB — PROTIME-INR
INR: 1.9 ratio — ABNORMAL HIGH (ref 0.8–1.0)
Prothrombin Time: 21.5 s — ABNORMAL HIGH (ref 9.6–13.1)

## 2020-04-01 NOTE — Patient Instructions (Addendum)
Please call [facility] to schedule an appointment with Dr. Marval Regal [phone]. Call [facility] (Dr. Tempie Hoist) to schedule an appointment-  [phone] Please call [facility] to schedule an appointment.  [phone]  Complete lab work Programmer, systems to leaving.

## 2020-04-01 NOTE — Progress Notes (Signed)
Subjective:    Patient ID: Annette Powers, female    DOB: [birth year], 85 y.o.   MRN: [id number]  HPI  Patient is an 85 yr old female who presents today for follow up.  Since her last visit she has sold her house in [place] and placed her belongings in storage.  She states that it was very emotionally exhausting.  Unfortunately, during this time she lost her car as part of the estate.  She does not like being dependent on others for transportation.  She is hoping that she has gotten through "the worst of it" and that things will start to get easier.  Hypothyroid- reports that she has been missing doses of her synthroid. Did not change her dose as we instructed last visit.  She states that she continues Synthroid 200 mcg once daily.  She sees neurology tomorrow and the Hepatologist doctor on Wednesday.     Reports SOB "comes and goes."  She continues Lasix 20 mg once daily. Wt Readings from Last 3 Encounters:  04/01/20 180 lb (81.6 kg)  02/19/20 177 lb (80.3 kg)  03/10/17 182 lb 15.7 oz (83 kg)   She has an appointment scheduled to establish with a local electrophysiologist, but has not gotten an appointment scheduled with a general cardiologist.   Review of Systems    see HPI  Past Medical History:  Diagnosis Date  . Abdominal bruit   . Atrial fibrillation ([place])   . Bronchitis   . CHF (congestive heart failure) ([place])   . Hyperlipidemia   . Hypertension   . Liver cirrhosis ([place])   . MAI (mycobacterium avium-intracellulare) ([place])   . Thyroid disease      Social History   Socioeconomic History  . Marital status: Widowed    Spouse name: Not on file  . Number of children: Not on file  . Years of education: Not on file  . Highest education level: Not on file  Occupational History  . Not on file  Tobacco Use  . Smoking status: Never Smoker  . Smokeless tobacco: Never Used  Vaping Use  . Vaping Use: Never used  Substance and Sexual Activity  . Alcohol use: Yes     Comment: rare alcohol  . Drug use: No  . Sexual activity: Not Currently  Other Topics Concern  . Not on file  Social History Narrative   Husband died in 05-04-19   Retired respiratory therapist   One daughter died at age 82   One living son, lives with im.   She belongs to the [employer]   No pets   Social Determinants of Health   Financial Resource Strain: Not on file  Food Insecurity: Not on file  Transportation Needs: Not on file  Physical Activity: Not on file  Stress: Not on file  Social Connections: Not on file  Intimate Partner Violence: Not on file    Past Surgical History:  Procedure Laterality Date  . ABDOMINAL HYSTERECTOMY  1985  . CHOLECYSTECTOMY  1969  . MITRAL VALVE REPLACEMENT  11/15/2013   bioprosthetic valve (redo)  . MITRAL VALVE REPLACEMENT  1998  . PACEMAKER INSERTION  07/07/2018  . TRICUSPID VALVE SURGERY  11/2013   repair  . VASCULAR SURGERY Left    had repair of aneurysm left arm following removal of central line    Family History  Problem Relation Age of Onset  . Breast cancer Mother        died at age 62  .  Hyperlipidemia Sister   . Diabetes Mellitus I Daughter     No Known Allergies  Current Outpatient Medications on File Prior to Visit  Medication Sig Dispense Refill  . albuterol (PROVENTIL HFA;VENTOLIN HFA) 108 (90 Base) MCG/ACT inhaler Inhale 1 puff into the lungs every 6 (six) hours as needed for wheezing or shortness of breath.    . carvedilol (COREG) 3.125 MG tablet Take 3.125 mg by mouth 2 (two) times daily with a meal.     . Cholecalciferol 5000 units TABS Take 5,000 Units by mouth daily.    . cholestyramine (QUESTRAN) 4 GM/DOSE powder Take 4 g by mouth as needed.     . ezetimibe (ZETIA) 10 MG tablet Take 10 mg by mouth daily.    . furosemide (LASIX) 20 MG tablet Take 20 mg by mouth daily.     Marland Kitchen gemfibrozil (LOPID) 600 MG tablet Take 600 mg by mouth at bedtime.     . irbesartan (AVAPRO) 150 MG tablet Take 150 mg  by mouth.     . levothyroxine (SYNTHROID) 200 MCG tablet Take 200 mcg by mouth daily.     Marland Kitchen levothyroxine (SYNTHROID) 25 MCG tablet Take 1 tablet (25 mcg total) by mouth daily before breakfast. 90 tablet 1  . primidone (MYSOLINE) 50 MG tablet Take 2 tabs nightly for 1 week then 2.5 tabs nightly for 1 week then 3 tabs nightly for 1 week then 3.5 tabs nightly for 1 week then 4 tabs nightly.  .    . spironolactone (ALDACTONE) 50 MG tablet Take 50 mg by mouth daily.     . traZODone (DESYREL) 100 MG tablet Take 100 mg by mouth at bedtime.    . vitamin B-12 (CYANOCOBALAMIN) 1000 MCG tablet Take 1,000 mcg by mouth daily.    Marland Kitchen warfarin (COUMADIN) 5 MG tablet 5mg  daily except 10mg  on Tuesday, Thursday and saturday 30 tablet 3   No current facility-administered medications on file prior to visit.    BP 124/80 (BP Location: Right Arm, Patient Position: Sitting, Cuff Size: Small)   Pulse 92   Temp 98.5 F (36.9 C) (Temporal)   Resp 16   Wt 180 lb (81.6 kg)   SpO2 98%   BMI 28.19 kg/m    Objective:   Physical Exam Constitutional:      Appearance: She is well-developed and well-nourished.  Neck:     Thyroid: No thyromegaly.  Cardiovascular:     Rate and Rhythm: Normal rate and regular rhythm.     Heart sounds: Normal heart sounds. No murmur heard.   Pulmonary:     Effort: Pulmonary effort is normal. No respiratory distress.     Breath sounds: Normal breath sounds. No wheezing.  Musculoskeletal:     Cervical back: Neck supple.     Right lower leg: 2+ Edema present.     Left lower leg: 2+ Edema present.  Skin:    General: Skin is warm and dry.  Neurological:     Mental Status: She is alert and oriented to person, place, and time.  Psychiatric:        Mood and Affect: Mood and affect normal.        Behavior: Behavior normal.        Thought Content: Thought content normal.        Judgment: Judgment normal.           Assessment & Plan:  Atrial fibrillation-rate is stable.  She  states she did not get her INR repeated  in [place] as previously discussed.  We will check follow-up INR today.  Of note the last INR was supra therapeutic.  Hypothyroid-will obtain follow-up TSH.  Continue Synthroid 200 mcg, and plan to adjust as needed.  CHF-she is maintained on Aldactone 50 mg daily and Lasix 20 mg p.o. daily.  While she does have some lower extremity edema, this seems to be at baseline and her lungs are clear.  I will defer any further adjustments to her Lasix to cardiology.  30 minutes spent on today's visit.  Time was spent counseling the patient, examining the patient and coordinating care.  This visit occurred during the SARS-CoV-2 public health emergency.  Safety protocols were in place, including screening questions prior to the visit, additional usage of staff PPE, and extensive cleaning of exam room while observing appropriate contact time as indicated for disinfecting solutions.

## 2020-04-01 NOTE — Telephone Encounter (Signed)
Can you please call cardiology upstairs and have them schedule an appointment with general cardiologist- She has an appointment with EP (Dr. Curt Bears) but also needs a general cardiologist- referral is in the system.

## 2020-04-02 ENCOUNTER — Telehealth: Payer: Self-pay | Admitting: Family

## 2020-04-02 ENCOUNTER — Encounter: Payer: Self-pay | Admitting: Neurology

## 2020-04-02 ENCOUNTER — Telehealth (INDEPENDENT_AMBULATORY_CARE_PROVIDER_SITE_OTHER): Payer: Medicare Other | Admitting: Neurology

## 2020-04-02 VITALS — BP 118/70 | HR 80 | Ht 67.0 in | Wt 171.0 lb

## 2020-04-02 DIAGNOSIS — K7460 Unspecified cirrhosis of liver: Secondary | ICD-10-CM | POA: Diagnosis not present

## 2020-04-02 DIAGNOSIS — D61818 Other pancytopenia: Secondary | ICD-10-CM | POA: Diagnosis not present

## 2020-04-02 DIAGNOSIS — G249 Dystonia, unspecified: Secondary | ICD-10-CM | POA: Diagnosis not present

## 2020-04-02 DIAGNOSIS — R251 Tremor, unspecified: Secondary | ICD-10-CM

## 2020-04-02 NOTE — Patient Instructions (Signed)
We discussed DaT scan today to decide whether or not this is just dystonic tremor or whether this dystonic tremor is associated with a more widespread disease process.  If you decided NOT to do the DaT scan, we can still refer you to Dr. Letta Pate for possible botox for the tremor aspect.  The physicians and staff at [facility] are committed to providing excellent care. You may receive a survey requesting feedback about your experience at our office. We strive to receive "very good" responses to the survey questions. If you feel that your experience would prevent you from giving the office a "very good " response, please contact our office to try to remedy the situation. We may be reached at [phone]. Thank you for taking the time out of your busy day to complete the survey.

## 2020-04-02 NOTE — Telephone Encounter (Signed)
Please advise pt that her thyroid testing is still abnormal.  I would recommend that she take her synthroid 243mcg every day in the AM on an empty stomach- 30 minutes before eating or taking other medications. Repeat TSH in 1 month.   INR is slightly low.  Has she missed any doses of coumadin?  If so, please continue current dose of coumadin and repeat INR in 1 week.

## 2020-04-02 NOTE — Telephone Encounter (Signed)
Patient advised of results and provider's advise. She reports she did missed some doses of both medications.  She was scheduled to come in for nurse visit INR  On 04-10-2020 and she will schedule her 1 month lab appointment when she comes in.

## 2020-04-02 NOTE — Telephone Encounter (Signed)
Called but no answer, Lvm for patient to call back

## 2020-04-08 ENCOUNTER — Other Ambulatory Visit: Payer: Self-pay

## 2020-04-08 DIAGNOSIS — I482 Chronic atrial fibrillation, unspecified: Secondary | ICD-10-CM

## 2020-04-08 NOTE — Telephone Encounter (Signed)
Contacted cardiology to let them know patient needs appointment with general cardiology. They will contact patient for appointment.

## 2020-04-09 ENCOUNTER — Telehealth: Payer: Self-pay | Admitting: Neurology

## 2020-04-09 ENCOUNTER — Other Ambulatory Visit: Payer: Self-pay

## 2020-04-09 DIAGNOSIS — E785 Hyperlipidemia, unspecified: Secondary | ICD-10-CM | POA: Insufficient documentation

## 2020-04-09 DIAGNOSIS — I10 Essential (primary) hypertension: Secondary | ICD-10-CM | POA: Insufficient documentation

## 2020-04-09 DIAGNOSIS — R001 Bradycardia, unspecified: Secondary | ICD-10-CM

## 2020-04-09 DIAGNOSIS — H3530 Unspecified macular degeneration: Secondary | ICD-10-CM | POA: Insufficient documentation

## 2020-04-09 DIAGNOSIS — K7460 Unspecified cirrhosis of liver: Secondary | ICD-10-CM | POA: Insufficient documentation

## 2020-04-09 DIAGNOSIS — A310 Pulmonary mycobacterial infection: Secondary | ICD-10-CM | POA: Insufficient documentation

## 2020-04-09 DIAGNOSIS — E079 Disorder of thyroid, unspecified: Secondary | ICD-10-CM | POA: Insufficient documentation

## 2020-04-09 DIAGNOSIS — Z953 Presence of xenogenic heart valve: Secondary | ICD-10-CM

## 2020-04-09 DIAGNOSIS — I509 Heart failure, unspecified: Secondary | ICD-10-CM | POA: Insufficient documentation

## 2020-04-09 DIAGNOSIS — J40 Bronchitis, not specified as acute or chronic: Secondary | ICD-10-CM | POA: Insufficient documentation

## 2020-04-09 DIAGNOSIS — R0989 Other specified symptoms and signs involving the circulatory and respiratory systems: Secondary | ICD-10-CM | POA: Insufficient documentation

## 2020-04-09 DIAGNOSIS — I4891 Unspecified atrial fibrillation: Secondary | ICD-10-CM | POA: Insufficient documentation

## 2020-04-09 HISTORY — DX: Presence of xenogenic heart valve: Z95.3

## 2020-04-09 HISTORY — DX: Bradycardia, unspecified: R00.1

## 2020-04-09 NOTE — Telephone Encounter (Signed)
Left a message for patient adivising her that DaTscans are only done at [facility] or [facility]. Advised a call back with any questions or concerns.

## 2020-04-09 NOTE — Telephone Encounter (Signed)
Patient called in stating she has decided to do the DaT scan. She said she would like it to be close to [facility] if possible and towards the end of the month.

## 2020-04-09 NOTE — Telephone Encounter (Signed)
Unfortunately, they don't do these in [place].  Will have to be [facility] or [facility].  I believe she already signed for it

## 2020-04-10 ENCOUNTER — Other Ambulatory Visit: Payer: Self-pay

## 2020-04-10 ENCOUNTER — Ambulatory Visit (INDEPENDENT_AMBULATORY_CARE_PROVIDER_SITE_OTHER): Payer: Medicare Other

## 2020-04-10 DIAGNOSIS — Z7901 Long term (current) use of anticoagulants: Secondary | ICD-10-CM

## 2020-04-10 LAB — POCT INR: INR: 1.5 — AB (ref 2.0–3.0)

## 2020-04-10 NOTE — Progress Notes (Signed)
Pt here for INR check per Earlie Counts  Last INR = 1.9 on 04-01-20   Goal INR: Telephone note Per Melissa"  INR is slightly low.  Has she missed any doses of coumadin?  If so, please continue current dose of coumadin and repeat INR in 1 week.   Pt currently takes Coumadin 5 mg daily. But takes 10 mg on Tuesdays.  Pt denies recent antibiotics, no dietary changes and no unusual bruising / bleeding.  INR today = 1.5  Pt advised per [name]: To add 10 mg dose to Thursday as well. And rpt in 1 week.

## 2020-04-10 NOTE — Telephone Encounter (Signed)
Spoke with patient and made her aware that she will have to complete her DaTscan at [facility] or [facility].   Patient voiced understanding and states she would like to have the [place] done later this month. Advised her that I would speak with the scheduler and make her aware. She voiced understanding.

## 2020-04-15 ENCOUNTER — Encounter: Payer: Self-pay | Admitting: Cardiology

## 2020-04-15 ENCOUNTER — Other Ambulatory Visit: Payer: Self-pay

## 2020-04-15 ENCOUNTER — Ambulatory Visit (INDEPENDENT_AMBULATORY_CARE_PROVIDER_SITE_OTHER): Payer: Medicare Other | Admitting: Cardiology

## 2020-04-15 ENCOUNTER — Telehealth: Payer: Self-pay | Admitting: Emergency Medicine

## 2020-04-15 VITALS — BP 110/76 | HR 84 | Ht 65.0 in | Wt 177.0 lb

## 2020-04-15 DIAGNOSIS — Z950 Presence of cardiac pacemaker: Secondary | ICD-10-CM | POA: Diagnosis not present

## 2020-04-15 DIAGNOSIS — I4821 Permanent atrial fibrillation: Secondary | ICD-10-CM | POA: Diagnosis not present

## 2020-04-15 DIAGNOSIS — R001 Bradycardia, unspecified: Secondary | ICD-10-CM | POA: Diagnosis not present

## 2020-04-15 NOTE — Patient Instructions (Signed)
Medication Instructions:  Your physician recommends that you continue on your current medications as directed. Please refer to the Current Medication list given to you today.  *If you need a refill on your cardiac medications before your next appointment, please call your pharmacy*   Lab Work: None ordered If you have labs (blood work) drawn today and your tests are completely normal, you will receive your results only by: Marland Kitchen MyChart Message (if you have MyChart) OR . A paper copy in the mail If you have any lab test that is abnormal or we need to change your treatment, we will call you to review the results.   Testing/Procedures: None ordered   Follow-Up: At [facility], you and your health needs are our priority.  As part of our continuing mission to provide you with exceptional heart care, we have created designated Provider Care Teams.  These Care Teams include your primary Cardiologist (physician) and Advanced Practice Providers (APPs -  Physician Assistants and Nurse Practitioners) who all work together to provide you with the care you need, when you need it.  We recommend signing up for the patient portal called "MyChart".  Sign up information is provided on this After Visit Summary.  MyChart is used to connect with patients for Virtual Visits (Telemedicine).  Patients are able to view lab/test results, encounter notes, upcoming appointments, etc.  Non-urgent messages can be sent to your provider as well.   To learn more about what you can do with MyChart, go to [email or web address].    Remote monitoring is used to monitor your Pacemaker or ICD from home. This monitoring reduces the number of office visits required to check your device to one time per year. It allows [place] to keep an eye on the functioning of your device to ensure it is working properly. You are scheduled for a device check from home on 07/15/2020. You may send your transmission at any time that day. If you have a  wireless device, the transmission will be sent automatically. After your physician reviews your transmission, you will receive a postcard with your next transmission date.  Your next appointment:   1 year(s)  The format for your next appointment:   In Person  Provider:   Allegra Lai, MD   Thank you for choosing [facility]!!   Trinidad Curet, RN [phone]    Other Instructions

## 2020-04-15 NOTE — Progress Notes (Signed)
Electrophysiology Office Note   Date:  04/15/2020   ID:  Edia Pursifull, DOB 07-11-1935, MRN [id number]  PCP:  [name], NP  Cardiologist:   Primary Electrophysiologist:  Will Meredith Leeds, MD    Chief Complaint: Pacemaker   History of Present Illness: Frankie Zito is a 85 y.o. female who is being seen today for the evaluation of pacemaker at the request of [name], NP. Presenting today for electrophysiology evaluation.  She has a history of mitral valve repair with redo mitral valve replacement 11/15/2013 with tricuspid repair via annuloplasty ring, atrial fibrillation, pulmonary hypertension.  She is status post Medtronic single-chamber pacemaker.  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  She has a history of CVA.  Today, she denies symptoms of palpitations, chest pain, shortness of breath, orthopnea, PND, lower extremity edema, claudication, dizziness, presyncope, syncope, bleeding, or neurologic sequela. The patient is tolerating medications without difficulties.  She has been feeling well.  She has no major complaints other than emotional issues from losing her significant other and having to move to [place].  She does have her son in the area.   Past Medical History:  Diagnosis Date  . Abdominal bruit   . Acute blood loss anemia 03/09/2017  . Anemia, unspecified 04/23/2008   Formatting of this note might be different from the original. Work up done 04/2008 showed normal Vit F09, folic acid, iron studies and no monoclonal protein. Normal sed rate. Most likely anemia due to renal insufficiency. Bone marrow biopsy performed 01/06/2011 showed variably cellular marrow with normocellular and hypocellular areas with no dysplastic changes. Adequate megakaryocytes. No malignanc  . Atrial fibrillation ([place])   . Atrial fibrillation, chronic ([place]) 03/09/2017  . Bradycardia 04/09/2020   Formatting  of this note might be different from the original. Pacemaker implanted   Last Assessment & Plan:  Formatting of this note might be different from the original. S/p pacemaker  . Bronchiectasis without complication ([place]) 05/02/5571  . Bronchitis   . CHF (congestive heart failure) ([place])   . Chronic renal failure, stage 3 (moderate) ([place]) 02/19/2020  . Congestive heart failure ([place]) 02/19/2020  . Cough 11/01/2012  . CVA (cerebral vascular accident) ([place]) 11/05/2009  . Dyspnea on exertion 05/16/2010   Last Assessment & Plan:  Formatting of this note might be different from the original. Stable at this time.  . Encounter for therapeutic drug level monitoring 12/21/2017  . Essential hypertension 03/09/2017  . Family history of malignant neoplasm of breast 05/26/2008   Formatting of this note might be different from the original. Mother died at age 65, sister diagnosed at age 32 with breast cancer. Maternal aunt with abdominal cancer(per patient not ovarian). Maternal grandmother died age 82 of CVA. Declined genetic councelling  . GI bleed 03/09/2017  . HLD (hyperlipidemia) 03/09/2017  . Hyperlipidemia   . Hypertension   . Hypothyroidism 03/09/2017  . Liver cirrhosis ([place])   . Long term (current) use of anticoagulants 10/30/2013   Last Assessment & Plan:  Formatting of this note is different from the original. No bleeding issues. Continue with warfarin  INR, Capillary (no units)  Date Value  06/16/2019 2.0  . LV dysfunction 07/05/2012   Formatting of this note might be different from the original. EF 40-45% per echo 05/2012  Echo 01/2015 LVEF 44%  06/2016 LVEF 45%   Last Assessment & Plan:  Formatting of this note might be different from the original. LVEF  45% per echo 06/2016. Currently asymptomatic. Will continue observation with a repeat echo. Remain on BB, aldactone and ARB regimen.  . Macular degeneration   . MAI (mycobacterium avium-intracellulare) ([place])   . Malignant neoplasm of main bronchus ([place]) 06/07/2018   . Mycobacterium avium infection ([place]) 02/19/2020  . Other cirrhosis of liver ([place]) 02/19/2020  . Pacemaker 02/19/2020  . Primary thrombocytopenia ([place]) 04/23/2008   Formatting of this note might be different from the original. Thrombocytopenia documented since 2005, mild, chronic. 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 US of the abdomen 01/2008 showed spleen measuring 16 cm.. Adequate megakaryocytes noted on bone marrow biopsy 12/2010  . 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 Struggling with nerve injury resulting in dominant L hand weakness and numbness. Quite depressed about the situation. Encouraged her to continue with therapy.  . Pulmonary hypertension ([place]) 02/19/2020  . Rectal bleeding 03/09/2017  . Splenomegaly 05/26/2008   Formatting of this note might be different from the original. Noted on 01/2008 renal ultrasound measuring 16X6.5 cm  . Status post mitral valve replacement with bioprosthetic valve 04/09/2020   Formatting of this note is different from the original. 11/2013 Barb Merino) Redo sternotomy, redo mitral valve surgery consisting of mitral valve replacement with a size 27 Hancock II porcine bioprosthesis, tricuspid valve repair utilizing a size 36 Carpentier-Edwards Classic tricuspid annuloplasty ring through a redo sternotomy.  06/22/16 Echocardiogram Mildly decreased left ventricular systolic func  . Thyroid disease   . Tremor 02/19/2020  . Vitamin D deficiency 02/19/2020   Past Surgical History:  Procedure Laterality Date  . ABDOMINAL HYSTERECTOMY  1985  . CHOLECYSTECTOMY  1969  . MITRAL VALVE REPLACEMENT  11/15/2013   bioprosthetic valve (redo)  . MITRAL VALVE REPLACEMENT  1998  . PACEMAKER INSERTION  07/07/2018  . TRICUSPID VALVE SURGERY  11/2013   repair  . VASCULAR SURGERY Left    had repair of aneurysm left arm following removal of central line     Current  Outpatient Medications  Medication Sig Dispense Refill  . albuterol (PROVENTIL HFA;VENTOLIN HFA) 108 (90 Base) MCG/ACT inhaler Inhale 1 puff into the lungs every 6 (six) hours as needed for wheezing or shortness of breath.    . allopurinol (ZYLOPRIM) 100 MG tablet Take 1 tablet by mouth daily.    . carvedilol (COREG) 3.125 MG tablet Take 3.125 mg by mouth 2 (two) times daily with a meal.     . Cholecalciferol 5000 units TABS Take 5,000 Units by mouth daily.    . cholestyramine (QUESTRAN) 4 GM/DOSE powder Take 4 g by mouth as needed.     . ezetimibe (ZETIA) 10 MG tablet Take 10 mg by mouth daily.    . furosemide (LASIX) 20 MG tablet Take 20 mg by mouth daily.     Marland Kitchen gemfibrozil (LOPID) 600 MG tablet Take 600 mg by mouth at bedtime.     . irbesartan (AVAPRO) 150 MG tablet Take 150 mg by mouth.     . levothyroxine (SYNTHROID) 200 MCG tablet Take 200 mcg by mouth daily.     . primidone (MYSOLINE) 50 MG tablet Take 150 mg by mouth at bedtime.    Marland Kitchen spironolactone (ALDACTONE) 50 MG tablet Take 50 mg by mouth daily.     . traZODone (DESYREL) 100 MG tablet Take 100 mg by mouth at bedtime.    Marland Kitchen warfarin (COUMADIN) 5 MG tablet 45m daily except 123mon  Tuesday, Thursday and saturday 30 tablet 3   No current facility-administered medications for this visit.    Allergies:   Patient has no known allergies.   Social History:  The patient  reports that she has never smoked. She has never used smokeless tobacco. She reports current alcohol use. She reports that she does not use drugs.   Family History:  The patient's family history includes Breast cancer in her mother; Diabetes Mellitus I (age of onset: 77) in her daughter; Hyperlipidemia in her sister.    ROS:  Please see the history of present illness.   Otherwise, review of systems is positive for none.   All other systems are reviewed and negative.    PHYSICAL EXAM: VS:  BP 110/76   Pulse 84   Ht 5' 5" (1.651 m)   Wt 177 lb (80.3 kg)   SpO2 96%    BMI 29.45 kg/m  , BMI Body mass index is 29.45 kg/m. GEN: Well nourished, well developed, in no acute distress  HEENT: normal  Neck: no JVD, carotid bruits, or masses Cardiac: irregular; no murmurs, rubs, or gallops,no edema  Respiratory:  clear to auscultation bilaterally, normal work of breathing GI: soft, nontender, nondistended, + BS MS: no deformity or atrophy  Skin: warm and dry, device pocket is well healed Neuro:  Strength and sensation are intact Psych: euthymic mood, full affect  EKG:  EKG is ordered today. Personal review of the ekg ordered shows atrial fibrillation, ventricular paced  Device interrogation is reviewed today in detail.  See PaceArt for details.   Recent Labs: 02/19/2020: ALT 12; BUN 26; Creatinine, Ser 1.51; Potassium 4.6; Sodium 139 04/01/2020: TSH 40.20    Lipid Panel     Component Value Date/Time   CHOL 148 02/19/2020 1104   TRIG 103.0 02/19/2020 1104   HDL 44.60 02/19/2020 1104   CHOLHDL 3 02/19/2020 1104   VLDL 20.6 02/19/2020 1104   LDLCALC 83 02/19/2020 1104     Wt Readings from Last 3 Encounters:  04/15/20 177 lb (80.3 kg)  04/02/20 171 lb (77.6 kg)  04/01/20 180 lb (81.6 kg)      Other studies Reviewed: Additional studies/ records that were reviewed today include: TTE 08/04/19  Review of the above records today demonstrates:  S/P redo 70m Hancock II porcine mitral valve replacement. Mitral valve mean gradient is 7.4 mmHg at a heart rate of 66 bpm. No mitral valve regurgitation. S/P 364mCE Classic tricuspid annuloplasty ring. No tricuspid valve stenosis, trace tricuspid valve regurgitation. Normal left ventricular cavity size and wall thickness. Mild global left ventricular hypokinesis, LVEF 42 %. Massive left atrial volume 150 ml/m. Normal right ventricular size and systolic function. Severely increased right atrial size. Mild pulmonary hypertension, RVSP 48 mmHg. No significant change since the prior study  06/22/2016   ASSESSMENT AND PLAN:  1.  Mitral valve replacement: Most recent echo in 2021 per report shows a stable valve.  She has follow-up with general cardiology on Wednesday.  2.  Permanent atrial fibrillation: Currently on warfarin.  CHA2DS2-VASc of 8.  3.  Pacemaker: Patient is status post Medtronic single-chamber pacemaker.  Device functioning appropriately.  We will arrange for follow-up in device clinic.    Current medicines are reviewed at length with the patient today.   The patient does not have concerns regarding her medicines.  The following changes were made today:  none  Labs/ tests ordered today include:  Orders Placed This Encounter  Procedures  . EKG 12-Lead  Disposition:   FU with Will Camnitz 1 year  Signed, Will Meredith Leeds, MD  04/15/2020 2:53 PM     [facility] [address] [place] [address] [phone] (office) [phone] (fax)

## 2020-04-15 NOTE — Telephone Encounter (Signed)
LMOM to contact [facility] to release patient in [place]. Patient of Dr. Marylyn Ishihara who established care with Dr Curt Bears today in [place]. Transfer requested in [place].

## 2020-04-16 ENCOUNTER — Other Ambulatory Visit (HOSPITAL_COMMUNITY): Payer: Self-pay | Admitting: Neurology

## 2020-04-16 DIAGNOSIS — R251 Tremor, unspecified: Secondary | ICD-10-CM

## 2020-04-17 ENCOUNTER — Ambulatory Visit (INDEPENDENT_AMBULATORY_CARE_PROVIDER_SITE_OTHER): Payer: Medicare Other | Admitting: *Deleted

## 2020-04-17 ENCOUNTER — Ambulatory Visit (INDEPENDENT_AMBULATORY_CARE_PROVIDER_SITE_OTHER): Payer: Medicare Other | Admitting: Cardiology

## 2020-04-17 ENCOUNTER — Other Ambulatory Visit: Payer: Self-pay

## 2020-04-17 ENCOUNTER — Encounter: Payer: Self-pay | Admitting: Cardiology

## 2020-04-17 VITALS — BP 128/74 | HR 78 | Ht 66.0 in | Wt 175.0 lb

## 2020-04-17 DIAGNOSIS — Z953 Presence of xenogenic heart valve: Secondary | ICD-10-CM

## 2020-04-17 DIAGNOSIS — Z950 Presence of cardiac pacemaker: Secondary | ICD-10-CM | POA: Diagnosis not present

## 2020-04-17 DIAGNOSIS — Z7901 Long term (current) use of anticoagulants: Secondary | ICD-10-CM | POA: Diagnosis not present

## 2020-04-17 DIAGNOSIS — I482 Chronic atrial fibrillation, unspecified: Secondary | ICD-10-CM

## 2020-04-17 DIAGNOSIS — I5042 Chronic combined systolic (congestive) and diastolic (congestive) heart failure: Secondary | ICD-10-CM | POA: Diagnosis not present

## 2020-04-17 LAB — POCT INR: INR: 1.6 — AB (ref 2.0–3.0)

## 2020-04-17 NOTE — Patient Instructions (Signed)
Medication Instructions:  Your physician recommends that you continue on your current medications as directed. Please refer to the Current Medication list given to you today.  *If you need a refill on your cardiac medications before your next appointment, please call your pharmacy*   Lab Work: Your physician recommends that you return for lab work today: bmp, pro bnp If you have labs (blood work) drawn today and your tests are completely normal, you will receive your results only by: Marland Kitchen MyChart Message (if you have MyChart) OR . A paper copy in the mail If you have any lab test that is abnormal or we need to change your treatment, we will call you to review the results.   Testing/Procedures: Your physician has requested that you have an echocardiogram. Echocardiography is a painless test that uses sound waves to create images of your heart. It provides your doctor with information about the size and shape of your heart and how well your heart's chambers and valves are working. This procedure takes approximately one hour. There are no restrictions for this procedure.    Follow-Up: At [facility], you and your health needs are our priority.  As part of our continuing mission to provide you with exceptional heart care, we have created designated Provider Care Teams.  These Care Teams include your primary Cardiologist (physician) and Advanced Practice Providers (APPs -  Physician Assistants and Nurse Practitioners) who all work together to provide you with the care you need, when you need it.  We recommend signing up for the patient portal called "MyChart".  Sign up information is provided on this After Visit Summary.  MyChart is used to connect with patients for Virtual Visits (Telemedicine).  Patients are able to view lab/test results, encounter notes, upcoming appointments, etc.  Non-urgent messages can be sent to your provider as well.   To learn more about what you can do with MyChart, go  to [email or web address].    Your next appointment:   4 week(s)  The format for your next appointment:   In Person  Provider:   Jenne Campus, MD   Other Instructions   Echocardiogram An echocardiogram is a test that uses sound waves (ultrasound) to produce images of the heart. Images from an echocardiogram can provide important information about:  Heart size and shape.  The size and thickness and movement of your heart's walls.  Heart muscle function and strength.  Heart valve function or if you have stenosis. Stenosis is when the heart valves are too narrow.  If blood is flowing backward through the heart valves (regurgitation).  A tumor or infectious growth around the heart valves.  Areas of heart muscle that are not working well because of poor blood flow or injury from a heart attack.  Aneurysm detection. An aneurysm is a weak or damaged part of an artery wall. The wall bulges out from the normal force of blood pumping through the body. Tell a health care provider about:  Any allergies you have.  All medicines you are taking, including vitamins, herbs, eye drops, creams, and over-the-counter medicines.  Any blood disorders you have.  Any surgeries you have had.  Any medical conditions you have.  Whether you are pregnant or may be pregnant. What are the risks? Generally, this is a safe test. However, problems may occur, including an allergic reaction to dye (contrast) that may be used during the test. What happens before the test? No specific preparation is needed. You may eat and drink normally.  What happens during the test?  You will take off your clothes from the waist up and put on a hospital gown.  Electrodes or electrocardiogram (ECG)patches may be placed on your chest. The electrodes or patches are then connected to a device that monitors your heart rate and rhythm.  You will lie down on a table for an ultrasound exam. A gel will be applied to  your chest to help sound waves pass through your skin.  A handheld device, called a transducer, will be pressed against your chest and moved over your heart. The transducer produces sound waves that travel to your heart and bounce back (or "echo" back) to the transducer. These sound waves will be captured in real-time and changed into images of your heart that can be viewed on a video monitor. The images will be recorded on a computer and reviewed by your health care provider.  You may be asked to change positions or hold your breath for a short time. This makes it easier to get different views or better views of your heart.  In some cases, you may receive contrast through an IV in one of your veins. This can improve the quality of the pictures from your heart. The procedure may vary among health care providers and hospitals.   What can I expect after the test? You may return to your normal, everyday life, including diet, activities, and medicines, unless your health care provider tells you not to do that. Follow these instructions at home:  It is up to you to get the results of your test. Ask your health care provider, or the department that is doing the test, when your results will be ready.  Keep all follow-up visits. This is important. Summary  An echocardiogram is a test that uses sound waves (ultrasound) to produce images of the heart.  Images from an echocardiogram can provide important information about the size and shape of your heart, heart muscle function, heart valve function, and other possible heart problems.  You do not need to do anything to prepare before this test. You may eat and drink normally.  After the echocardiogram is completed, you may return to your normal, everyday life, unless your health care provider tells you not to do that. This information is not intended to replace advice given to you by your health care provider. Make sure you discuss any questions you have  with your health care provider. Document Revised: 09/19/2019 Document Reviewed: 09/19/2019 Elsevier Patient Education  2021 [employer].

## 2020-04-17 NOTE — Addendum Note (Signed)
Addended by: Senaida Ores on: 04/17/2020 02:30 PM   Modules accepted: Orders

## 2020-04-17 NOTE — Progress Notes (Signed)
Pt here for INR check per Earlie Counts  Last INR = 1.5  Goal INR: 2.0-3.0  Pt currently takes Coumadin 5 mg daily. But takes 10 mg on Tuesdays and Thursdays.  Pt denies recent antibiotics, no dietary changes and no unusual bruising / bleeding.  INR today =1.6  Per Melissa take 5mg  daily and on Tuesday, Thursday, Saturday take 10mg .  Return in one week for recheck.  Advised patient about note above and she stated that she will be back in 2 weeks.  She feels that one week is too early and we need to give medication time to work.  She also have to have someone bring her and this will give her time to have someone to bring her.  Patient also feels that she should "stop doctoring" this. She seem very frustrated at visit.   Patient has been scheduled 05/01/20, two weeks out.

## 2020-04-17 NOTE — Progress Notes (Signed)
Cardiology Consultation:    Date:  04/17/2020   ID:  Natale Milch, DOB 04-05-1935, MRN [id number]  PCP:  [name], NP  Cardiologist:  Jenne Campus, MD   Referring MD: [name], NP   Chief Complaint  Patient presents with  . fluid retention    History of Present Illness:    Annette Powers is a 85 y.o. female who is being seen today for the evaluation of multiple cardiac issues at the request of [name], NP.  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  She did have mitral valve replacement done many years ago and then in 2015 October she required 3 intervention for tricuspid valve regurgitation.  At that time she did have repair of tricuspid valve with annuloplasty ring with redo sternotomy.  She does have permanent atrial fibrillation, pulmonary hypertension, single-chamber pacemaker, she does have left ventricle dysfunction.  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  She also had history of CVA.  She did have a cardiac catheterization before her surgery in 2015 which showed mild diffuse atherosclerotic changes but no critical stenosis identified.  She relocated to [place] to join her son after her significant other of 35 years past.  She is coming from [place]. She comes today to my office she would like to be establish as a patient.  She described to have some fatigue tiredness and shortness of breath as well as some fluid retention.  She is very knowledgeable about her condition she describes swelling of lower extremities worse at evening time.  Does not have much paroxysmal nocturnal dyspnea.  There is no palpitations no dizziness no passing out.  There was a time recently that she skipped all her medications she simply were taking care of her significant other and did not have time to take care of herself however now she  is back to her routine. She never smoked She does not exercise on the regular basis She has been taking cholesterol medication but lately stop it because she is not able to swallow it. Her ejection fraction of left ventricle is typically neighborhood of 45%   Past Medical History:  Diagnosis Date  . Abdominal bruit   . Acute blood loss anemia 03/09/2017  . Anemia, unspecified 04/23/2008   Formatting of this note might be different from the original. Work up done 04/2008 showed normal Vit O03, folic acid, iron studies and no monoclonal protein. Normal sed rate. Most likely anemia due to renal insufficiency. Bone marrow biopsy performed 01/06/2011 showed variably cellular marrow with normocellular and hypocellular areas with no dysplastic changes. Adequate megakaryocytes. No malignanc  . Atrial fibrillation ([place])   . Atrial fibrillation, chronic ([place]) 03/09/2017  . Bradycardia 04/09/2020   Formatting of this note might be different from the original. Pacemaker implanted   Last Assessment & Plan:  Formatting of this note might be different from the original. S/p pacemaker  . Bronchiectasis without complication ([place]) 08/13/8887  . Bronchitis   . CHF (congestive heart failure) ([place])   . Chronic renal failure, stage 3 (moderate) ([place]) 02/19/2020  . Congestive heart failure ([place]) 02/19/2020  . Cough 11/01/2012  . CVA (cerebral vascular accident) ([place]) 11/05/2009  . Dyspnea on exertion 05/16/2010   Last Assessment & Plan:  Formatting of this note might be different from the original. Stable at this time.  . Encounter for therapeutic drug level monitoring 12/21/2017  . Essential  hypertension 03/09/2017  . Family history of malignant neoplasm of breast 05/26/2008   Formatting of this note might be different from the original. Mother died at age 44, sister diagnosed at age 69 with breast cancer. Maternal aunt with abdominal cancer(per patient not ovarian). Maternal grandmother died age 43 of CVA. Declined genetic  councelling  . GI bleed 03/09/2017  . HLD (hyperlipidemia) 03/09/2017  . Hyperlipidemia   . Hypertension   . Hypothyroidism 03/09/2017  . Liver cirrhosis ([place])   . Long term (current) use of anticoagulants 10/30/2013   Last Assessment & Plan:  Formatting of this note is different from the original. No bleeding issues. Continue with warfarin  INR, Capillary (no units)  Date Value  06/16/2019 2.0  . LV dysfunction 07/05/2012   Formatting of this note might be different from the original. EF 40-45% per echo 05/2012  Echo 01/2015 LVEF 44%  06/2016 LVEF 45%   Last Assessment & Plan:  Formatting of this note might be different from the original. LVEF 45% per echo 06/2016. Currently asymptomatic. Will continue observation with a repeat echo. Remain on BB, aldactone and ARB regimen.  . Macular degeneration   . MAI (mycobacterium avium-intracellulare) ([place])   . Malignant neoplasm of main bronchus ([place]) 06/07/2018  . Mycobacterium avium infection ([place]) 02/19/2020  . Other cirrhosis of liver ([place]) 02/19/2020  . Pacemaker 02/19/2020  . Primary thrombocytopenia ([place]) 04/23/2008   Formatting of this note might be different from the original. Thrombocytopenia documented since 2005, mild, chronic. 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 US of the abdomen 01/2008 showed spleen measuring 16 cm.. Adequate megakaryocytes noted on bone marrow biopsy 12/2010  . 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 Struggling with nerve injury resulting in dominant L hand weakness and numbness. Quite depressed about the situation. Encouraged her to continue with therapy.  . Pulmonary hypertension ([place]) 02/19/2020  . Rectal bleeding 03/09/2017  . Splenomegaly 05/26/2008   Formatting of this note might be different from the original. Noted on 01/2008 renal ultrasound measuring 16X6.5 cm  . Status post mitral valve replacement with  bioprosthetic valve 04/09/2020   Formatting of this note is different from the original. 11/2013 Barb Merino) Redo sternotomy, redo mitral valve surgery consisting of mitral valve replacement with a size 27 Hancock II porcine bioprosthesis, tricuspid valve repair utilizing a size 36 Carpentier-Edwards Classic tricuspid annuloplasty ring through a redo sternotomy.  06/22/16 Echocardiogram Mildly decreased left ventricular systolic func  . Thyroid disease   . Tremor 02/19/2020  . Vitamin D deficiency 02/19/2020    Past Surgical History:  Procedure Laterality Date  . ABDOMINAL HYSTERECTOMY  1985  . CHOLECYSTECTOMY  1969  . MITRAL VALVE REPLACEMENT  11/15/2013   bioprosthetic valve (redo)  . MITRAL VALVE REPLACEMENT  1998  . PACEMAKER INSERTION  07/07/2018  . TRICUSPID VALVE SURGERY  11/2013   repair  . VASCULAR SURGERY Left    had repair of aneurysm left arm following removal of central line    Current Medications: Current Meds  Medication Sig  . albuterol (PROVENTIL HFA;VENTOLIN HFA) 108 (90 Base) MCG/ACT inhaler Inhale 1 puff into the lungs every 6 (six) hours as needed for wheezing or shortness of breath.  . carvedilol (COREG) 3.125 MG tablet Take 3.125 mg by mouth 2 (two) times daily with a meal.   . Cholecalciferol 5000 units TABS Take 5,000 Units by mouth daily.  . cholestyramine Lucrezia Starch)  4 GM/DOSE powder Take 4 g by mouth as needed.   . ezetimibe (ZETIA) 10 MG tablet Take 10 mg by mouth daily.  . furosemide (LASIX) 20 MG tablet Take 20 mg by mouth daily.   . irbesartan (AVAPRO) 150 MG tablet Take 150 mg by mouth.   . levothyroxine (SYNTHROID) 200 MCG tablet Take 200 mcg by mouth daily.   . Multiple Vitamins-Minerals (PRESERVISION AREDS PO) Take by mouth 2 (two) times daily.  . primidone (MYSOLINE) 50 MG tablet Take 150 mg by mouth at bedtime.  Marland Kitchen spironolactone (ALDACTONE) 50 MG tablet Take 50 mg by mouth daily.   . traZODone (DESYREL) 100 MG tablet Take 100 mg by mouth at bedtime.  Marland Kitchen  warfarin (COUMADIN) 5 MG tablet 42m daily except 123mon Tuesday, Thursday and saturday  . [DISCONTINUED] allopurinol (ZYLOPRIM) 100 MG tablet Take 1 tablet by mouth daily.     Allergies:   Patient has no known allergies.   Social History   Socioeconomic History  . Marital status: Widowed    Spouse name: Not on file  . Number of children: Not on file  . Years of education: Not on file  . Highest education level: Not on file  Occupational History  . Not on file  Tobacco Use  . Smoking status: Never Smoker  . Smokeless tobacco: Never Used  Vaping Use  . Vaping Use: Never used  Substance and Sexual Activity  . Alcohol use: Yes    Comment: rare alcohol  . Drug use: No  . Sexual activity: Not Currently  Other Topics Concern  . Not on file  Social History Narrative   Husband died in 2001-05-30 Retired respiratory therapist   One daughter died at age 83 One living son, lives with im.   She belongs to the [employer]   No pets   Social Determinants of Health   Financial Resource Strain: Not on file  Food Insecurity: Not on file  Transportation Needs: Not on file  Physical Activity: Not on file  Stress: Not on file  Social Connections: Not on file     Family History: The patient's family history includes Breast cancer in her mother; Diabetes Mellitus I (age of onset: 2772in her daughter; Hyperlipidemia in her sister. ROS:   Please see the history of present illness.    All 14 point review of systems negative except as described per history of present illness.  EKGs/Labs/Other Studies Reviewed:    The following studies were reviewed today:  Echocardiogram done in [place] in the August 04, 2019 showed: Echo 08/04/2019: S/P redo [id number] II porcine mitral valve replacement. Mitral valve mean gradient is 7.4 mmHg at a heart rate of 66 bpm. No mitral valve regurgitation. S/P 23m56m Classic tricuspid annuloplasty ring. No tricuspid valve stenosis,  trace tricuspid valve regurgitation. Normal left ventricular cavity size and wall thickness. Mild global left ventricular hypokinesis, LVEF 42 %. Massive left atrial volume 150 ml/m. Normal right ventricular size and systolic function. Severely increased right atrial size. Mild pulmonary hypertension, RVSP 48 mmHg. No significant change since the prior study 06/22/2016   EKG:  EKG is  ordered today.  The ekg ordered today demonstrates ventricular paced rhythm,  Recent Labs: 02/19/2020: ALT 12; BUN 26; Creatinine, Ser 1.51; Potassium 4.6; Sodium 139 04/01/2020: TSH 40.20  Recent Lipid Panel    Component Value Date/Time   CHOL 148 02/19/2020 1104   TRIG 103.0 02/19/2020 1104  HDL 44.60 02/19/2020 1104   CHOLHDL 3 02/19/2020 1104   VLDL 20.6 02/19/2020 1104   LDLCALC 83 02/19/2020 1104    Physical Exam:    VS:  BP 128/74 (BP Location: Right Arm, Patient Position: Sitting)   Pulse 78   Ht 5' 6"  (1.676 m)   Wt 175 lb (79.4 kg)   SpO2 97%   BMI 28.25 kg/m     Wt Readings from Last 3 Encounters:  04/17/20 175 lb (79.4 kg)  04/15/20 177 lb (80.3 kg)  04/02/20 171 lb (77.6 kg)     GEN:  Well nourished, well developed in no acute distress HEENT: Normal NECK: No JVD; No carotid bruits LYMPHATICS: No lymphadenopathy CARDIAC: RRR, no murmurs, no rubs, no gallops RESPIRATORY:  Clear to auscultation without rales, wheezing or rhonchi  ABDOMEN: Soft, non-tender, non-distended MUSCULOSKELETAL: 1+ swelling of lower extremities SKIN: Warm and dry NEUROLOGIC:  Alert and oriented x 3 PSYCHIATRIC:  Normal affect   ASSESSMENT:    1. Atrial fibrillation, chronic ([place])   2. Chronic combined systolic and diastolic congestive heart failure (HCC)   3. Status post mitral valve replacement with bioprosthetic valve   4. Pacemaker    PLAN:    In order of problems listed above:  1. Status post mitral valve replacement with 27 mm Hancock 2 porcine valve with redo in 2015, latest  assessment based on echocardiogram from June 2021 showed gradient across the valve of 7.4 with a mean gradient.  She also had tricuspid annuloplasty ring 36 mm done in 2015.  Will reassess her valves and function of her heart that by doing echocardiogram.  I will do proBNP as well as Chem-7 then decide about therapy in terms of her congestive heart failure. 2. Permanent atrial fibrillation, noted.  She is on Coumadin she wants to be on Coumadin.  She does not want any other medications. 3. Cardiomyopathy with ejection fraction of 42% based on echocardiogram from June 2021.  Will reassess left ventricle ejection fraction.  I will check her kidney function.  She may need adjustment to her medications.  Will wait for results of Chem-7 that we will do today. 4. Pacemaker present that being already followed by our EP team. 5. Hypothyroidism.  She admits that she did not take her medications when she should and she ended up having hypothyroidism that being adjusted again her medications back on board I will continu monitoring. 6. Pulmonary hypertension with latest estimation of pulmonary pressure based on echocardiogram from June 2021 showing 48 mmHg.  This is most likely group 2 related to her mitral valve problem  She is a delightful lady she told me straight that she does not want to have any  surgery done.  And she understand what her problems are   Medication Adjustments/Labs and Tests Ordered: Current medicines are reviewed at length with the patient today.  Concerns regarding medicines are outlined above.  No orders of the defined types were placed in this encounter.  No orders of the defined types were placed in this encounter.   Signed, Park Liter, MD, [facility]. 04/17/2020 2:21 PM    [facility]

## 2020-04-17 NOTE — Patient Instructions (Addendum)
Warfarin  Monday- 1 tablet  Tuesday- 2 tablets  Wednesday - 1 tablet  Thursday- 2 tablets  Friday- 1 tablet  Saturday - 2 tablets  Sunday - 1 tablet

## 2020-04-18 LAB — BASIC METABOLIC PANEL
BUN/Creatinine Ratio: 18 (ref 12–28)
BUN: 26 mg/dL (ref 8–27)
CO2: 23 mmol/L (ref 20–29)
Calcium: 9 mg/dL (ref 8.7–10.3)
Chloride: 103 mmol/L (ref 96–106)
Creatinine, Ser: 1.48 mg/dL — ABNORMAL HIGH (ref 0.57–1.00)
Glucose: 98 mg/dL (ref 65–99)
Potassium: 4.7 mmol/L (ref 3.5–5.2)
Sodium: 140 mmol/L (ref 134–144)
eGFR: 35 mL/min/{1.73_m2} — ABNORMAL LOW (ref >59)

## 2020-04-18 LAB — PRO B NATRIURETIC PEPTIDE: NT-Pro BNP: 9430 pg/mL — ABNORMAL HIGH (ref 0–738)

## 2020-04-18 NOTE — Telephone Encounter (Signed)
Patient has been released in [place].

## 2020-04-23 ENCOUNTER — Telehealth: Payer: Self-pay

## 2020-04-23 MED ORDER — FUROSEMIDE 40 MG PO TABS: 40.0000 mg | ORAL_TABLET | Freq: Every day | ORAL | 3 refills | Status: DC

## 2020-04-23 NOTE — Telephone Encounter (Signed)
Patient notified of results and verbalized understanding. I advised the patient to double on her furosemide 20 until she runs out then she can take Lasix 40 I'll be sending. Patient will arrange blood work with PCP([facility] physician).

## 2020-05-01 ENCOUNTER — Other Ambulatory Visit: Payer: Self-pay

## 2020-05-01 ENCOUNTER — Ambulatory Visit (INDEPENDENT_AMBULATORY_CARE_PROVIDER_SITE_OTHER): Payer: Medicare Other | Admitting: Family

## 2020-05-01 DIAGNOSIS — Z7901 Long term (current) use of anticoagulants: Secondary | ICD-10-CM | POA: Diagnosis not present

## 2020-05-01 LAB — POCT INR: INR: 1.7 — AB (ref 2.0–3.0)

## 2020-05-01 NOTE — Progress Notes (Signed)
Pt here for INR check perMelissa OSullivan  Last INR= 1.6  Goal INR: 2.0-3.0  Pt currently takes  Coumadin 5mg  daily and on Tuesday, Thursday, Saturday take 10mg .  Pt denies recent antibiotics, no dietary changes and no unusual bruising / bleeding.  INR today =1.7  Per Lenna Sciara take she would pt to take 10 mg Tuesday, Thursday, Saturday and Sunday. 5 mg Monday, Wednesday, Friday. Pt is scheduled to return in 2 week on 05/15/20 @11 :00am.

## 2020-05-07 ENCOUNTER — Telehealth: Payer: Self-pay | Admitting: Neurology

## 2020-05-07 ENCOUNTER — Encounter (HOSPITAL_COMMUNITY)
Admission: RE | Admit: 2020-05-07 | Discharge: 2020-05-07 | Disposition: A | Discharge status: Home / Self Care | Payer: Medicare Other | Source: Ambulatory Visit | Attending: Neurology | Admitting: Neurology

## 2020-05-07 ENCOUNTER — Other Ambulatory Visit: Payer: Self-pay

## 2020-05-07 DIAGNOSIS — R251 Tremor, unspecified: Secondary | ICD-10-CM | POA: Diagnosis not present

## 2020-05-07 MED ORDER — POTASSIUM IODIDE (ANTIDOTE) 130 MG PO TABS: 130.0000 mg | ORAL_TABLET | Freq: Once | ORAL | Status: DC

## 2020-05-07 MED ORDER — POTASSIUM IODIDE (ANTIDOTE) 130 MG PO TABS
ORAL_TABLET | ORAL | Status: AC
  Filled 2020-05-07: qty 1

## 2020-05-07 NOTE — Telephone Encounter (Signed)
Let pt know that DaT scan looks okay.  No convincing evidence of parkinsonism.  As we discussed in visit, if she decides to pursue botox, we can refer her to dr Letta Pate for that.

## 2020-05-08 NOTE — Telephone Encounter (Signed)
Spoke with patient and made her aware of test results. She voiced understanding and states she will contact the office if she decides to do botox.

## 2020-05-15 ENCOUNTER — Ambulatory Visit: Payer: Medicare Other

## 2020-05-19 ENCOUNTER — Encounter (HOSPITAL_BASED_OUTPATIENT_CLINIC_OR_DEPARTMENT_OTHER): Payer: Self-pay

## 2020-05-19 ENCOUNTER — Other Ambulatory Visit: Payer: Self-pay

## 2020-05-19 ENCOUNTER — Emergency Department (HOSPITAL_BASED_OUTPATIENT_CLINIC_OR_DEPARTMENT_OTHER)
Admission: EM | Admit: 2020-05-19 | Discharge: 2020-05-19 | Disposition: A | Discharge status: Home / Self Care | Payer: Medicare Other | Attending: Emergency Medicine | Admitting: Emergency Medicine

## 2020-05-19 DIAGNOSIS — I130 Hypertensive heart and chronic kidney disease with heart failure and stage 1 through stage 4 chronic kidney disease, or unspecified chronic kidney disease: Secondary | ICD-10-CM | POA: Diagnosis not present

## 2020-05-19 DIAGNOSIS — Z954 Presence of other heart-valve replacement: Secondary | ICD-10-CM | POA: Insufficient documentation

## 2020-05-19 DIAGNOSIS — I4891 Unspecified atrial fibrillation: Secondary | ICD-10-CM | POA: Insufficient documentation

## 2020-05-19 DIAGNOSIS — N183 Chronic kidney disease, stage 3 unspecified: Secondary | ICD-10-CM | POA: Insufficient documentation

## 2020-05-19 DIAGNOSIS — E039 Hypothyroidism, unspecified: Secondary | ICD-10-CM | POA: Diagnosis not present

## 2020-05-19 DIAGNOSIS — J069 Acute upper respiratory infection, unspecified: Secondary | ICD-10-CM | POA: Diagnosis not present

## 2020-05-19 DIAGNOSIS — Z20822 Contact with and (suspected) exposure to covid-19: Secondary | ICD-10-CM | POA: Insufficient documentation

## 2020-05-19 DIAGNOSIS — Z8673 Personal history of transient ischemic attack (TIA), and cerebral infarction without residual deficits: Secondary | ICD-10-CM | POA: Insufficient documentation

## 2020-05-19 DIAGNOSIS — I509 Heart failure, unspecified: Secondary | ICD-10-CM | POA: Insufficient documentation

## 2020-05-19 DIAGNOSIS — Z950 Presence of cardiac pacemaker: Secondary | ICD-10-CM | POA: Diagnosis not present

## 2020-05-19 DIAGNOSIS — Z79899 Other long term (current) drug therapy: Secondary | ICD-10-CM | POA: Insufficient documentation

## 2020-05-19 DIAGNOSIS — R059 Cough, unspecified: Secondary | ICD-10-CM | POA: Diagnosis present

## 2020-05-19 DIAGNOSIS — Z7901 Long term (current) use of anticoagulants: Secondary | ICD-10-CM | POA: Insufficient documentation

## 2020-05-19 LAB — SARS CORONAVIRUS 2 (TAT 6-24 HRS): SARS Coronavirus 2: NEGATIVE

## 2020-05-19 MED ORDER — AZITHROMYCIN 250 MG PO TABS: 250.0000 mg | ORAL_TABLET | Freq: Every day | ORAL | 0 refills | Status: DC

## 2020-05-19 NOTE — Discharge Instructions (Addendum)
You were seen today for an upper respiratory infection.  I did prescribe you the azithromycin, which to follow-up with your primary care in the next couple of days.  Use Flonase as directed for your congestion and Mucinex/Robitussin for your cough. Continue to stay well-hydrated. Gargle warm salt water and spit it out for sore throat. Take benadryl or other antihistamine to decrease secretions and for watery itchy eyes. Continued to alternate between Tylenol and ibuprofen for pain. Return to emergency department for emergent changing or worsening of symptoms- example: shortness of breath, chest pain, difficulty swallowing, muffled voice.  Your Covid test will result in the next 6 to 24 hours, keep an eye on this, if it is positive please follow-up with your PCP and follow CDC guidelines.  Please speak to pharmacist about interactions with azithromycin or side effects of this medication.

## 2020-05-19 NOTE — ED Triage Notes (Signed)
Pt reports sore throat, congestion, headache for 10 days, denies fever, mild runny nose.  Not tested for covid.  Recently moved to [place] from [place]. Was unable to see pmd this week

## 2020-05-19 NOTE — ED Provider Notes (Signed)
[facility] EMERGENCY DEPARTMENT Provider Note   CSN: [id number] Arrival date & time: 05/19/20  0947     History Chief Complaint  Patient presents with  . Sore Throat  . Cough    Annette Powers is a 85 y.o. female) past medical history of CHF, CKD stage III, A. fib on Coumadin, hypertension, hyperlipidemia, pulmonary hypertension the presents the emerge department today for 10 days of cough and congestion.  Patient states that she moved here from [place] a couple months ago.  Has been getting set up with different doctors in the area.  Patient states that she has not been able to get in with a pulmonologist yet, states that once a year she gets the same symptoms, normally is prescribed a Z-Pak from her pulmonologist and her symptoms go away.  No history of COPD.  No history of smoking.  Patient states that she primarily came here to get a Z-Pak since she knows her body and these are recurrent symptoms that present every year.  Denies any chest pain, fevers, nausea, vomiting, diarrhea.  No new shortness of breath.  Denies any known exposures.  Has been vaccinated against COVID.  States that sore throat is more of a dry sensation after she coughs.  Cough is primarily not productive, sometimes will spit up green mucus.  Denies any lower respiratory symptoms.  Denies any facial pain, eye pain or ear pain.  States that she sometimes has a headache with this, has not been taking anything.  No other complaints at this time.  HPI     Past Medical History:  Diagnosis Date  . Abdominal bruit   . Acute blood loss anemia 03/09/2017  . Anemia, unspecified 04/23/2008   Formatting of this note might be different from the original. Work up done 04/2008 showed normal Vit V40, folic acid, iron studies and no monoclonal protein. Normal sed rate. Most likely anemia due to renal insufficiency. Bone marrow biopsy performed 01/06/2011 showed variably cellular marrow with normocellular and hypocellular  areas with no dysplastic changes. Adequate megakaryocytes. No malignanc  . Atrial fibrillation ([place])   . Atrial fibrillation, chronic ([place]) 03/09/2017  . Bradycardia 04/09/2020   Formatting of this note might be different from the original. Pacemaker implanted   Last Assessment & Plan:  Formatting of this note might be different from the original. S/p pacemaker  . Bronchiectasis without complication ([place]) 0/86/7619  . Bronchitis   . CHF (congestive heart failure) ([place])   . Chronic renal failure, stage 3 (moderate) ([place]) 02/19/2020  . Congestive heart failure ([place]) 02/19/2020  . Cough 11/01/2012  . CVA (cerebral vascular accident) ([place]) 11/05/2009  . Dyspnea on exertion 05/16/2010   Last Assessment & Plan:  Formatting of this note might be different from the original. Stable at this time.  . Encounter for therapeutic drug level monitoring 12/21/2017  . Essential hypertension 03/09/2017  . Family history of malignant neoplasm of breast 05/26/2008   Formatting of this note might be different from the original. Mother died at age 65, sister diagnosed at age 53 with breast cancer. Maternal aunt with abdominal cancer(per patient not ovarian). Maternal grandmother died age 46 of CVA. Declined genetic councelling  . GI bleed 03/09/2017  . HLD (hyperlipidemia) 03/09/2017  . Hyperlipidemia   . Hypertension   . Hypothyroidism 03/09/2017  . Liver cirrhosis ([place])   . Long term (current) use of anticoagulants 10/30/2013   Last Assessment & Plan:  Formatting of this note is different from the original.  No bleeding issues. Continue with warfarin  INR, Capillary (no units)  Date Value  06/16/2019 2.0  . LV dysfunction 07/05/2012   Formatting of this note might be different from the original. EF 40-45% per echo 05/2012  Echo 01/2015 LVEF 44%  06/2016 LVEF 45%   Last Assessment & Plan:  Formatting of this note might be different from the original. LVEF 45% per echo 06/2016. Currently asymptomatic. Will continue observation  with a repeat echo. Remain on BB, aldactone and ARB regimen.  . Macular degeneration   . MAI (mycobacterium avium-intracellulare) ([place])   . Malignant neoplasm of main bronchus ([place]) 06/07/2018  . Mycobacterium avium infection ([place]) 02/19/2020  . Other cirrhosis of liver ([place]) 02/19/2020  . Pacemaker 02/19/2020  . Primary thrombocytopenia ([place]) 04/23/2008   Formatting of this note might be different from the original. Thrombocytopenia documented since 2005, mild, chronic. 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 US of the abdomen 01/2008 showed spleen measuring 16 cm.. Adequate megakaryocytes noted on bone marrow biopsy 12/2010  . 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 Struggling with nerve injury resulting in dominant L hand weakness and numbness. Quite depressed about the situation. Encouraged her to continue with therapy.  . Pulmonary hypertension ([place]) 02/19/2020  . Rectal bleeding 03/09/2017  . Splenomegaly 05/26/2008   Formatting of this note might be different from the original. Noted on 01/2008 renal ultrasound measuring 16X6.5 cm  . Status post mitral valve replacement with bioprosthetic valve 04/09/2020   Formatting of this note is different from the original. 11/2013 Barb Merino) Redo sternotomy, redo mitral valve surgery consisting of mitral valve replacement with a size 27 Hancock II porcine bioprosthesis, tricuspid valve repair utilizing a size 36 Carpentier-Edwards Classic tricuspid annuloplasty ring through a redo sternotomy.  06/22/16 Echocardiogram Mildly decreased left ventricular systolic func  . Thyroid disease   . Tremor 02/19/2020  . Vitamin D deficiency 02/19/2020    Patient Active Problem List   Diagnosis Date Noted  . Bradycardia 04/09/2020  . Status post mitral valve replacement with bioprosthetic valve 04/09/2020  . Thyroid disease   . MAI (mycobacterium  avium-intracellulare) ([place])   . Macular degeneration   . Liver cirrhosis ([place])   . Hypertension   . Hyperlipidemia   . CHF (congestive heart failure) ([place])   . Bronchitis   . Atrial fibrillation ([place])   . Abdominal bruit   . Mycobacterium avium infection ([place]) 02/19/2020  . Bronchiectasis without complication ([place]) 42/59/5638  . Pulmonary hypertension ([place]) 02/19/2020  . Other cirrhosis of liver ([place]) 02/19/2020  . Pacemaker 02/19/2020  . Congestive heart failure ([place]) 02/19/2020  . Chronic renal failure, stage 3 (moderate) ([place]) 02/19/2020  . Vitamin D deficiency 02/19/2020  . Tremor 02/19/2020  . Malignant neoplasm of main bronchus ([place]) 06/07/2018  . Encounter for therapeutic drug level monitoring 12/21/2017  . GI bleed 03/09/2017  . Rectal bleeding 03/09/2017  . Atrial fibrillation, chronic ([place]) 03/09/2017  . Essential hypertension 03/09/2017  . HLD (hyperlipidemia) 03/09/2017  . Acute blood loss anemia 03/09/2017  . Hypothyroidism 03/09/2017  . 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 Long term (current) use of anticoagulants 10/30/2013  . Cough 11/01/2012  . LV dysfunction 07/05/2012  . Dyspnea on exertion 05/16/2010  . CVA (cerebral vascular accident) ([place]) 11/05/2009  . Family history of malignant neoplasm of breast 05/26/2008  . Splenomegaly 05/26/2008  . Anemia, unspecified 04/23/2008  .  Primary thrombocytopenia ([place]) 04/23/2008    Past Surgical History:  Procedure Laterality Date  . ABDOMINAL HYSTERECTOMY  1985  . CHOLECYSTECTOMY  1969  . MITRAL VALVE REPLACEMENT  11/15/2013   bioprosthetic valve (redo)  . MITRAL VALVE REPLACEMENT  1998  . PACEMAKER INSERTION  07/07/2018  . TRICUSPID VALVE SURGERY  11/2013   repair  . 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     Family History  Problem Relation Age of Onset  . Breast cancer Mother         died at age 34  . Hyperlipidemia Sister   . Diabetes Mellitus I Daughter 56    Social History   Tobacco Use  . Smoking status: Never Smoker  . Smokeless tobacco: Never Used  Vaping Use  . Vaping Use: Never used  Substance Use Topics  . Alcohol use: Yes    Comment: rare alcohol  . Drug use: No    Home Medications Prior to Admission medications   Medication Sig Start Date End Date Taking? Authorizing Provider  azithromycin (ZITHROMAX) 250 MG tablet Take 1 tablet (250 mg total) by mouth daily. Take first 2 tablets together, then 1 every day until finished. 05/19/20  Yes Veronika Heard, Deberah Pelton, PA-C  albuterol (PROVENTIL HFA;VENTOLIN HFA) 108 (90 Base) MCG/ACT inhaler Inhale 1 puff into the lungs every 6 (six) hours as needed for wheezing or shortness of breath.    [provider]  carvedilol (COREG) 3.125 MG tablet Take 3.125 mg by mouth 2 (two) times daily with a meal.     [provider]  Cholecalciferol 5000 units TABS Take 5,000 Units by mouth daily.    [provider]  cholestyramine Lucrezia Starch) 4 GM/DOSE powder Take 4 g by mouth as needed.     [provider]  ezetimibe (ZETIA) 10 MG tablet Take 10 mg by mouth daily.    [provider]  furosemide (LASIX) 40 MG tablet Take 1 tablet (40 mg total) by mouth daily. 04/23/20 07/22/20  Park Liter, MD  irbesartan (AVAPRO) 150 MG tablet Take 150 mg by mouth.     [provider]  levothyroxine (SYNTHROID) 200 MCG tablet Take 200 mcg by mouth daily.     [provider]  Multiple Vitamins-Minerals (PRESERVISION AREDS PO) Take by mouth 2 (two) times daily.    [provider]  primidone (MYSOLINE) 50 MG tablet Take 150 mg by mouth at bedtime. 01/12/20   [provider]  spironolactone (ALDACTONE) 50 MG tablet Take 50 mg by mouth daily.     [provider]  traZODone (DESYREL) 100 MG tablet Take 100 mg by mouth at bedtime.    [provider]   warfarin (COUMADIN) 5 MG tablet 70m daily except 133mon Tuesday, Thursday and saturday 02/19/20   O'Debbrah AlarNP    Allergies    Patient has no known allergies.  Review of Systems   Review of Systems  Constitutional: Negative for chills, diaphoresis, fatigue and fever.  HENT: Positive for congestion and sore throat. Negative for ear discharge, ear pain, facial swelling, nosebleeds, sinus pressure, sinus pain, trouble swallowing and voice change.   Eyes: Negative for pain and visual disturbance.  Respiratory: Positive for cough. Negative for shortness of breath and wheezing.   Cardiovascular: Negative for chest pain, palpitations and leg swelling.  Gastrointestinal: Negative for abdominal distention,  abdominal pain, diarrhea, nausea and vomiting.  Genitourinary: Negative for difficulty urinating.  Musculoskeletal: Negative for back pain, neck pain and neck stiffness.  Skin: Negative for pallor.  Neurological: Negative for dizziness, speech difficulty, weakness and headaches.  Psychiatric/Behavioral: Negative for confusion.    Physical Exam Updated Vital Signs BP 121/90   Pulse (!) 58   Temp 98.3 F (36.8 C) (Oral)   Resp 16   Ht _0  (1.702 m)   Wt 79.4 kg   SpO2 98%   BMI 27.41 kg/m   Physical Exam Constitutional:      General: She is not in acute distress.    Appearance: Normal appearance. She is not ill-appearing, toxic-appearing or diaphoretic.     Comments: Patient without acute respiratory stress.  Patient is sitting comfortably in bed, no tripoding, use of accessory muscles.  Patient is speaking to me in full sentences.  Handling secretions well.  HENT:     Head: Normocephalic and atraumatic.     Jaw: There is normal jaw occlusion. No trismus, swelling or malocclusion.     Comments: No facial pain    Nose: Congestion present. No rhinorrhea.     Right Sinus: No maxillary sinus tenderness or frontal sinus tenderness.     Left Sinus: No maxillary sinus  tenderness or frontal sinus tenderness.     Mouth/Throat:     Mouth: Mucous membranes are moist. No oral lesions.     Dentition: Normal dentition.     Tongue: No lesions.     Palate: No mass and lesions.     Pharynx: Oropharynx is clear. Uvula midline. No pharyngeal swelling, oropharyngeal exudate, posterior oropharyngeal erythema or uvula swelling.     Tonsils: No tonsillar exudate or tonsillar abscesses. 1+ on the right. 1+ on the left.     Comments: Patient without tonsillar enlargement or exudate.  No signs of peritonsillar abscess, palate without any tenderness or masses palpated.  No swelling under the tongue, uvula is midline without any inflammation. Eyes:     General: No visual field deficit.       Right eye: No discharge.        Left eye: No discharge.     Extraocular Movements: Extraocular movements intact.     Conjunctiva/sclera: Conjunctivae normal.     Pupils: Pupils are equal, round, and reactive to light.  Cardiovascular:     Rate and Rhythm: Normal rate and regular rhythm.     Pulses: Normal pulses.     Heart sounds: Normal heart sounds. No murmur heard. No friction rub. No gallop.   Pulmonary:     Effort: Pulmonary effort is normal. No respiratory distress.     Breath sounds: Normal breath sounds. No stridor. No wheezing, rhonchi or rales.  Chest:     Chest wall: No tenderness.  Abdominal:     General: Abdomen is flat. Bowel sounds are normal. There is no distension.     Palpations: Abdomen is soft.     Tenderness: There is no abdominal tenderness. There is no right CVA tenderness or left CVA tenderness.  Musculoskeletal:        General: No swelling or tenderness. Normal range of motion.     Cervical back: Normal range of motion. No rigidity or tenderness.     Right lower leg: No edema.     Left lower leg: No edema.  Lymphadenopathy:     Cervical: No cervical adenopathy.  Skin:    General: Skin is warm and dry.  Capillary Refill: Capillary refill takes less  than 2 seconds.     Findings: No erythema or rash.  Neurological:     General: No focal deficit present.     Mental Status: She is alert and oriented to person, place, and time.     Cranial Nerves: Cranial nerves are intact. No cranial nerve deficit or facial asymmetry.     Motor: Motor function is intact. No weakness.     Coordination: Coordination is intact.     Gait: Gait is intact. Gait normal.  Psychiatric:        Mood and Affect: Mood normal.     ED Results / Procedures / Treatments   Labs (all labs ordered are listed, but only abnormal results are displayed) Labs Reviewed  SARS CORONAVIRUS 2 (TAT 6-24 HRS)    EKG None  Radiology No results found.  Procedures Procedures   Medications Ordered in ED Medications - No data to display  ED Course  I have reviewed the triage vital signs and the nursing notes.  Pertinent labs & imaging results that were available during my care of the patient were reviewed by me and considered in my medical decision making (see chart for details).    MDM Rules/Calculators/A&P                          Annette Powers is a 85 y.o. female) past medical history of CHF, CKD stage III, A. fib on Coumadin, hypertension, hyperlipidemia, pulmonary hypertension the presents the emerge department today for 10 days of cough and congestion.  No shortness of breath or chest pain or fevers.  Patient appears well, originally ordered labs and chest x-ray for 10 days of cough and congestion, however patient is hemodynamically stable, and afebrile nontachycardic.  Patient states that she would rather not have these labs or chest x-ray, shared decision-making, feel like this is reasonable since patient has clear lungs with no red flag symptoms.  Asking for Z-Pak, also think this  is reasonable since patient has had cough for 10 days.  States that she normally gets this and her symptoms go away.  Patient will follow up with PCP, did try to go to her PCP today  however was unable to be seen.  No other complaints at this time.  Strict return precautions given.  Doubt need for further emergent work up at this time. I explained the diagnosis and have given explicit precautions to return to the ER including for any other new or worsening symptoms. The patient understands and accepts the medical plan as it's been dictated and I have answered their questions. Discharge instructions concerning home care and prescriptions have been given. The patient is STABLE and is discharged to home in good condition.  I discussed this case with my attending physician who cosigned this note including patient's presenting symptoms, physical exam, and planned diagnostics and interventions. Attending physician stated agreement with plan or made changes to plan which were implemented.     Final Clinical Impression(s) / ED Diagnoses Final diagnoses:  Viral upper respiratory tract infection    Rx / DC Orders ED Discharge Orders         Ordered    azithromycin (ZITHROMAX) 250 MG tablet  Daily        05/19/20 1100           Alfredia Client, PA-C 05/19/20 1108    Davonna Belling, MD 05/19/20 1507

## 2020-05-20 ENCOUNTER — Other Ambulatory Visit: Payer: Self-pay

## 2020-05-20 ENCOUNTER — Telehealth: Payer: Self-pay | Admitting: Family

## 2020-05-20 MED ORDER — SPIRONOLACTONE 50 MG PO TABS: 50.0000 mg | ORAL_TABLET | Freq: Every day | ORAL | 1 refills | Status: DC

## 2020-05-20 MED ORDER — LEVOTHYROXINE SODIUM 200 MCG PO TABS: 200.0000 ug | ORAL_TABLET | Freq: Every day | ORAL | 1 refills | Status: DC

## 2020-05-20 MED ORDER — CARVEDILOL 3.125 MG PO TABS: 3.1250 mg | ORAL_TABLET | Freq: Two times a day (BID) | ORAL | 1 refills | Status: DC

## 2020-05-20 MED ORDER — IRBESARTAN 150 MG PO TABS: 150.0000 mg | ORAL_TABLET | Freq: Once | ORAL | 1 refills | Status: DC

## 2020-05-20 NOTE — Telephone Encounter (Signed)
Prescriptions sent to avapro, coreg, synthroid and aldactone sent today

## 2020-05-20 NOTE — Telephone Encounter (Signed)
Pt called to  requesting all of her prescription be sent  To [facility]

## 2020-05-21 ENCOUNTER — Ambulatory Visit: Payer: Medicare Other

## 2020-05-27 ENCOUNTER — Encounter: Payer: Self-pay | Admitting: Internal Medicine

## 2020-05-27 ENCOUNTER — Ambulatory Visit (HOSPITAL_BASED_OUTPATIENT_CLINIC_OR_DEPARTMENT_OTHER)
Admission: RE | Admit: 2020-05-27 | Discharge: 2020-05-27 | Disposition: A | Discharge status: Home / Self Care | Payer: Medicare Other | Source: Ambulatory Visit | Attending: Internal Medicine | Admitting: Internal Medicine

## 2020-05-27 ENCOUNTER — Telehealth: Payer: Self-pay

## 2020-05-27 ENCOUNTER — Other Ambulatory Visit: Payer: Self-pay

## 2020-05-27 ENCOUNTER — Telehealth (INDEPENDENT_AMBULATORY_CARE_PROVIDER_SITE_OTHER): Payer: Medicare Other | Admitting: Internal Medicine

## 2020-05-27 ENCOUNTER — Ambulatory Visit (HOSPITAL_BASED_OUTPATIENT_CLINIC_OR_DEPARTMENT_OTHER): Payer: Medicare Other

## 2020-05-27 VITALS — BP 138/80 | HR 103 | Ht 66.0 in | Wt 175.0 lb

## 2020-05-27 DIAGNOSIS — R0600 Dyspnea, unspecified: Secondary | ICD-10-CM | POA: Insufficient documentation

## 2020-05-27 DIAGNOSIS — I482 Chronic atrial fibrillation, unspecified: Secondary | ICD-10-CM | POA: Diagnosis not present

## 2020-05-27 DIAGNOSIS — R0609 Other forms of dyspnea: Secondary | ICD-10-CM

## 2020-05-27 NOTE — Patient Instructions (Addendum)
Increase carvedilol 3.125 mg: 2 tablets twice a day, other medications the same  Check the  blood pressure daily BP GOAL is between 110/65 and  135/85. If it is consistently higher or lower, let me know  GO TO THE LAB : Get the blood work    Go to the third floor, cardiology office, reschedule the echocardiogram, needs to be done this week   STOP BY THE FIRST FLOOR:  get the XR    See Dr. Agustin Cree as scheduled on 06/03/2020  ER if not gradually better or if you get much worse.

## 2020-05-27 NOTE — Telephone Encounter (Signed)
Chief Complaint BREATHING - shortness of breath or sounds breathless Reason for Call Symptomatic / Request for [place] states his mother has chest congestion and seen in the emergency room. She was not subscribed a antibiotic or steroid. She is unable to lay down do to the amount of green phlegm that she is coughing up and shortness of breath Translation No No Triage Reason Patient declined Nurse Assessment Nurse: Ronnald Ramp, RN, Miranda Date/Time (Eastern Time): 05/27/2020 7:19:08 AM Confirm and document reason for call. If symptomatic, describe symptoms. ---Caller states she was seen in the ED 1 week ago. Diagnosed with something viral. She still has productive cough x 1 month. He states she is having SOB. He states she is having to sleep in a recliner because when she lays down the mucus builds up and she has SOB and coughing. Does the patient have any new or worsening symptoms? ---Yes Will a triage be completed? ---No Select reason for no triage. ---Patient declined Please document clinical information provided and list any resource used. ---Triage offered and refused. They are just wanting an appt, preferably virtual. Told caller they would need to call back at 8am to make an appt.

## 2020-05-27 NOTE — Telephone Encounter (Signed)
Patient in office seeing Dr Larose Kells

## 2020-05-27 NOTE — Progress Notes (Signed)
Subjective:    Patient ID: Annette Powers, female    DOB: 02-02-1936, 85 y.o.   MRN: [id number]  DOS:  05/27/2020 Type of visit - description: Acute, visit this started as a PVO but ended up in person. Next Symptoms a started a month ago with cough, chest congestion mostly at the upper chest, at some point she was coughing up thick green sputum, she had a headache with a cough.  Went to the ER on 05/19/2020, a COVID test was negative, she was prescribed Zithromax. Reports  the sputum has changed to somewhat clear, she has noted few drops of blood sometimes in the sputum.  She has chronic DOE but that has increased significantly in the last 2 to 3-week  No fever chills Some nausea but no vomiting. No myalgias Some lower extremity edema, recently cardiology increased her Lasix. Has not gained weight on her own scales.  Wt Readings from Last 3 Encounters:  05/27/20 175 lb (79.4 kg)  05/19/20 175 lb (79.4 kg)  04/17/20 175 lb (79.4 kg)     Review of Systems See above   Past Medical History:  Diagnosis Date  . Abdominal bruit   . Acute blood loss anemia 03/09/2017  . Anemia, unspecified 04/23/2008   Formatting of this note might be different from the original. Work up done 04/2008 showed normal Vit U23, folic acid, iron studies and no monoclonal protein. Normal sed rate. Most likely anemia due to renal insufficiency. Bone marrow biopsy performed 01/06/2011 showed variably cellular marrow with normocellular and hypocellular areas with no dysplastic changes. Adequate megakaryocytes. No malignanc  . Atrial fibrillation ([place])   . Atrial fibrillation, chronic ([place]) 03/09/2017  . Bradycardia 04/09/2020   Formatting of this note might be different from the original. Pacemaker implanted   Last Assessment & Plan:  Formatting of this note might be different from the original. S/p pacemaker  . Bronchiectasis without complication ([place]) 5/36/1443  . Bronchitis   . CHF (congestive heart failure)  ([place])   . Chronic renal failure, stage 3 (moderate) ([place]) 02/19/2020  . Congestive heart failure ([place]) 02/19/2020  . Cough 11/01/2012  . CVA (cerebral vascular accident) ([place]) 11/05/2009  . Dyspnea on exertion 05/16/2010   Last Assessment & Plan:  Formatting of this note might be different from the original. Stable at this time.  . Encounter for therapeutic drug level monitoring 12/21/2017  . Essential hypertension 03/09/2017  . Family history of malignant neoplasm of breast 05/26/2008   Formatting of this note might be different from the original. Mother died at age 79, sister diagnosed at age 38 with breast cancer. Maternal aunt with abdominal cancer(per patient not ovarian). Maternal grandmother died age 89 of CVA. Declined genetic councelling  . GI bleed 03/09/2017  . HLD (hyperlipidemia) 03/09/2017  . Hyperlipidemia   . Hypertension   . Hypothyroidism 03/09/2017  . Liver cirrhosis ([place])   . Long term (current) use of anticoagulants 10/30/2013   Last Assessment & Plan:  Formatting of this note is different from the original. No bleeding issues. Continue with warfarin  INR, Capillary (no units)  Date Value  06/16/2019 2.0  . LV dysfunction 07/05/2012   Formatting of this note might be different from the original. EF 40-45% per echo 05/2012  Echo 01/2015 LVEF 44%  06/2016 LVEF 45%   Last Assessment & Plan:  Formatting of this note might be different from the original. LVEF 45% per echo 06/2016. Currently asymptomatic. Will continue observation with a repeat echo.  Remain on BB, aldactone and ARB regimen.  . Macular degeneration   . MAI (mycobacterium avium-intracellulare) ([place])   . Malignant neoplasm of main bronchus ([place]) 06/07/2018  . Mycobacterium avium infection ([place]) 02/19/2020  . Other cirrhosis of liver ([place]) 02/19/2020  . Pacemaker 02/19/2020  . Primary thrombocytopenia ([place]) 04/23/2008   Formatting of this note might be different from the original. Thrombocytopenia documented since 2005, mild,  chronic. 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 US of the abdomen 01/2008 showed spleen measuring 16 cm.. Adequate megakaryocytes noted on bone marrow biopsy 12/2010  . 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 Struggling with nerve injury resulting in dominant L hand weakness and numbness. Quite depressed about the situation. Encouraged her to continue with therapy.  . Pulmonary hypertension ([place]) 02/19/2020  . Rectal bleeding 03/09/2017  . Splenomegaly 05/26/2008   Formatting of this note might be different from the original. Noted on 01/2008 renal ultrasound measuring 16X6.5 cm  . Status post mitral valve replacement with bioprosthetic valve 04/09/2020   Formatting of this note is different from the original. 11/2013 Barb Merino) Redo sternotomy, redo mitral valve surgery consisting of mitral valve replacement with a size 27 Hancock II porcine bioprosthesis, tricuspid valve repair utilizing a size 36 Carpentier-Edwards Classic tricuspid annuloplasty ring through a redo sternotomy.  06/22/16 Echocardiogram Mildly decreased left ventricular systolic func  . Thyroid disease   . Tremor 02/19/2020  . Vitamin D deficiency 02/19/2020    Past Surgical History:  Procedure Laterality Date  . ABDOMINAL HYSTERECTOMY  1985  . CHOLECYSTECTOMY  1969  . MITRAL VALVE REPLACEMENT  11/15/2013   bioprosthetic valve (redo)  . MITRAL VALVE REPLACEMENT  1998  . PACEMAKER INSERTION  07/07/2018  . TRICUSPID VALVE SURGERY  11/2013   repair  . VASCULAR SURGERY Left    had repair of aneurysm left arm following removal of central line    Allergies as of 05/27/2020   No Known Allergies     Medication List       Accurate as of May 27, 2020 11:59 PM. If you have any questions, ask your nurse or doctor.        STOP taking these medications   azithromycin 250 MG tablet Commonly known as: ZITHROMAX Stopped by:  Kathlene November, MD     TAKE these medications   albuterol 108 (90 Base) MCG/ACT inhaler Commonly known as: VENTOLIN HFA Inhale 1 puff into the lungs every 6 (six) hours as needed for wheezing or shortness of breath.   carvedilol 3.125 MG tablet Commonly known as: COREG Take 2 tablets (6.25 mg total) by mouth 2 (two) times daily with a meal. What changed: how much to take Changed by: Kathlene November, MD   Cholecalciferol 125 MCG (5000 UT) Tabs Take 5,000 Units by mouth daily.   cholestyramine 4 GM/DOSE powder Commonly known as: QUESTRAN Take 4 g by mouth as needed.   ezetimibe 10 MG tablet Commonly known as: ZETIA Take 10 mg by mouth daily.   furosemide 40 MG tablet Commonly known as: LASIX Take 1 tablet (40 mg total) by mouth daily.   irbesartan 150 MG tablet Commonly known as: AVAPRO Take 1 tablet (150 mg total) by mouth once for 1 dose.   levothyroxine 200 MCG tablet Commonly known as: SYNTHROID Take 1 tablet (200 mcg total) by mouth daily.   PRESERVISION AREDS PO Take by mouth 2 (two) times daily.  primidone 50 MG tablet Commonly known as: MYSOLINE Take 150 mg by mouth at bedtime.   spironolactone 50 MG tablet Commonly known as: ALDACTONE Take 1 tablet (50 mg total) by mouth daily.   traZODone 100 MG tablet Commonly known as: DESYREL Take 100 mg by mouth at bedtime.   warfarin 5 MG tablet Commonly known as: COUMADIN 60m daily except 160mon Tuesday, Thursday and saturday          Objective:   Physical Exam BP 138/80 (BP Location: Left Arm, Patient Position: Sitting, Cuff Size: Small)   Pulse (!) 103   Ht 5' 6" (1.676 m)   Wt 175 lb (79.4 kg)   SpO2 98%   BMI 28.25 kg/m  General:   Well developed, NAD, BMI noted.  HEENT:  Normocephalic . Face symmetric, atraumatic Neck: Hard to assess, slight increase JVD at 45 degrees? Lungs:  CTA B No intercostal retractions, no accessory muscle use but she did appear dyspneic after she came from the waiting room to  the examining room Heart: Irregularly irregular Abdomen:  Not distended, soft, non-tender. No rebound or rigidity.   Skin: Not pale. Not jaundice Lower extremities: Trace pretibial edema bilaterally  Neurologic:  alert & oriented X3.  Speech normal, gait appropriate for age and unassisted Psych--  Cognition and judgment appear intact.  Cooperative with normal attention span and concentration.  Behavior appropriate. No anxious or depressed appearing.     Assessment    [age]ear old female, PMH includes A. fib, HTN, CHF, CVA previously, bronchiectasis, cirrhosis of the liver, on Coumadin, had a COVID [employer]ine follow-up by modern booster 12-2019, presents with:  DOE: [age]ear old female with multiple medical problems, anticoagulated, presented with respiratory symptoms for the last 4 weeks and definitely worsening  DOE from baseline.  She got short of breath and look in mild to moderate distress when she walked approximately 40 steps from the front desk to the examining room. She has not gained weight on her own scales, she only has trace bilateral lower extremity edema, EKG today: Atrial fibrillation, previous EKG show paced rhythm. She is not in florid volume overloaded. Situation discussed with Dr. ToHarriet Masson I appreciate her help. Symptoms are likely related to atrial fibrillation, we agreed on the following: Increase carvedilol dose, proceed with echo which was canceled by the patient today, follow-up with cardiology as scheduled 06/03/2020. Also will check BMP, CBC INR Strongly recommend the patient to go to the ER if she feels worse. 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   This visit occurred during the SARS-CoV-2 public health emergency.  Safety protocols were in place, including screening questions prior to the visit, additional usage of staff PPE, and extensive  cleaning of exam room while observing appropriate contact time as indicated for disinfecting solutions.

## 2020-05-28 ENCOUNTER — Other Ambulatory Visit (INDEPENDENT_AMBULATORY_CARE_PROVIDER_SITE_OTHER): Payer: Medicare Other

## 2020-05-28 ENCOUNTER — Telehealth: Payer: Self-pay | Admitting: *Deleted

## 2020-05-28 ENCOUNTER — Telehealth: Payer: Self-pay

## 2020-05-28 DIAGNOSIS — E039 Hypothyroidism, unspecified: Secondary | ICD-10-CM

## 2020-05-28 LAB — CBC WITH DIFFERENTIAL/PLATELET
Basophils Absolute: 0 10*3/uL (ref 0.0–0.1)
Basophils Relative: 0.9 % (ref 0.0–3.0)
Eosinophils Absolute: 0.1 10*3/uL (ref 0.0–0.7)
Eosinophils Relative: 2.5 % (ref 0.0–5.0)
HCT: 30.4 % — ABNORMAL LOW (ref 36.0–46.0)
Hemoglobin: 10.1 g/dL — ABNORMAL LOW (ref 12.0–15.0)
Lymphocytes Relative: 18 % (ref 12.0–46.0)
Lymphs Abs: 0.7 10*3/uL (ref 0.7–4.0)
MCHC: 33.1 g/dL (ref 30.0–36.0)
MCV: 89.9 fl (ref 78.0–100.0)
Monocytes Absolute: 0.4 10*3/uL (ref 0.1–1.0)
Monocytes Relative: 10.1 % (ref 3.0–12.0)
Neutro Abs: 2.6 10*3/uL (ref 1.4–7.7)
Neutrophils Relative %: 68.5 % (ref 43.0–77.0)
Platelets: 92 10*3/uL — ABNORMAL LOW (ref 150.0–400.0)
RBC: 3.38 Mil/uL — ABNORMAL LOW (ref 3.87–5.11)
RDW: 14.3 % (ref 11.5–15.5)
WBC: 3.8 10*3/uL — ABNORMAL LOW (ref 4.0–10.5)

## 2020-05-28 LAB — BASIC METABOLIC PANEL
BUN: 26 mg/dL — ABNORMAL HIGH (ref 6–23)
CO2: 24 mEq/L (ref 19–32)
Calcium: 8.9 mg/dL (ref 8.4–10.5)
Chloride: 109 mEq/L (ref 96–112)
Creatinine, Ser: 1.5 mg/dL — ABNORMAL HIGH (ref 0.40–1.20)
GFR: 32.12 mL/min — ABNORMAL LOW (ref >60.00)
Glucose, Bld: 94 mg/dL (ref 70–99)
Potassium: 4.8 mEq/L (ref 3.5–5.1)
Sodium: 140 mEq/L (ref 135–145)

## 2020-05-28 LAB — TSH: TSH: 31.96 u[IU]/mL — ABNORMAL HIGH (ref 0.35–4.50)

## 2020-05-28 LAB — PROTIME-INR
INR: 6.1 ratio (ref 0.8–1.0)
Prothrombin Time: 67.4 s (ref 9.6–13.1)

## 2020-05-28 NOTE — Telephone Encounter (Signed)
Per previous tel note: Called pt with critical INR  result and advised per Dr Larose Kells stop Coumadin today he will talk with Lenna Sciara about results and will get back with patient. Pt is not having any abnormal bleeding but was advised to go to the ER is so. -Jma

## 2020-05-28 NOTE — Telephone Encounter (Signed)
Patient was scheduled to come in Friday at 3:20 (she has an appointment in the morning for an echocardiogram)

## 2020-05-28 NOTE — Telephone Encounter (Signed)
Please contact pt to schedule an in person visit with me + INR check on Friday.

## 2020-05-28 NOTE — Telephone Encounter (Signed)
Spoke with the patient about the results: INR is elevated, creatinine function hemoglobin satisfactory, chest x-ray: Mild CHF. She feels about the same today. No blood in the urine or in the stool.  No headache or abdominal pain. Plan: Hold Coumadin, if any bleeding call or go to the ER Watch her breathing, if it is worse: ER otherwise keep the appointment with cardiology. Will communicate with PCP to see about next INR. Also on further chart review I noticed her last TSH was quite high, reports she is taking Synthroid 200 mcg plus Synthroid 25 mcg every day. Please add a TSH to the blood already drawn

## 2020-05-28 NOTE — Telephone Encounter (Signed)
CRITICAL VALUE STICKER  CRITICAL VALUE:  PT - 67.4, INR - 6.1  RECEIVER (on-site recipient of call): Icis Budreau,cma  DATE & TIME NOTIFIED: 05/28/20 @ 10:59am  MESSENGER (representative from lab):  Shari Prows.  MD NOTIFIED: [place]: 11am  RESPONSE:

## 2020-05-28 NOTE — Telephone Encounter (Signed)
Add on faxed to [facility] lab for TSH.

## 2020-05-28 NOTE — Telephone Encounter (Signed)
Called pt with critical INR  result and advised per Dr Larose Kells stop Coumadin today he will talk with Lenna Sciara about results and will get back with patient. Pt is not having any abnormal bleeding but was advised to go to the ER is so. -Jma

## 2020-05-31 ENCOUNTER — Ambulatory Visit (HOSPITAL_COMMUNITY)
Admission: RE | Admit: 2020-05-31 | Discharge: 2020-05-31 | Disposition: A | Discharge status: Home / Self Care | Payer: Medicare Other | Source: Ambulatory Visit | Attending: Cardiology | Admitting: Cardiology

## 2020-05-31 ENCOUNTER — Ambulatory Visit (INDEPENDENT_AMBULATORY_CARE_PROVIDER_SITE_OTHER): Payer: Medicare Other | Admitting: Family

## 2020-05-31 ENCOUNTER — Other Ambulatory Visit: Payer: Self-pay

## 2020-05-31 VITALS — BP 131/64 | HR 85 | Temp 97.9°F | Resp 18 | Ht 66.0 in | Wt 179.0 lb

## 2020-05-31 DIAGNOSIS — I482 Chronic atrial fibrillation, unspecified: Secondary | ICD-10-CM

## 2020-05-31 DIAGNOSIS — I4891 Unspecified atrial fibrillation: Secondary | ICD-10-CM | POA: Diagnosis not present

## 2020-05-31 DIAGNOSIS — I77819 Aortic ectasia, unspecified site: Secondary | ICD-10-CM | POA: Insufficient documentation

## 2020-05-31 DIAGNOSIS — E039 Hypothyroidism, unspecified: Secondary | ICD-10-CM | POA: Diagnosis not present

## 2020-05-31 DIAGNOSIS — Z8673 Personal history of transient ischemic attack (TIA), and cerebral infarction without residual deficits: Secondary | ICD-10-CM | POA: Insufficient documentation

## 2020-05-31 DIAGNOSIS — Z7901 Long term (current) use of anticoagulants: Secondary | ICD-10-CM | POA: Diagnosis not present

## 2020-05-31 DIAGNOSIS — Z950 Presence of cardiac pacemaker: Secondary | ICD-10-CM | POA: Diagnosis not present

## 2020-05-31 DIAGNOSIS — Z953 Presence of xenogenic heart valve: Secondary | ICD-10-CM

## 2020-05-31 DIAGNOSIS — E785 Hyperlipidemia, unspecified: Secondary | ICD-10-CM | POA: Insufficient documentation

## 2020-05-31 DIAGNOSIS — I110 Hypertensive heart disease with heart failure: Secondary | ICD-10-CM | POA: Diagnosis not present

## 2020-05-31 DIAGNOSIS — I081 Rheumatic disorders of both mitral and tricuspid valves: Secondary | ICD-10-CM | POA: Diagnosis not present

## 2020-05-31 DIAGNOSIS — I5042 Chronic combined systolic (congestive) and diastolic (congestive) heart failure: Secondary | ICD-10-CM | POA: Diagnosis not present

## 2020-05-31 DIAGNOSIS — I272 Pulmonary hypertension, unspecified: Secondary | ICD-10-CM | POA: Diagnosis not present

## 2020-05-31 LAB — ECHOCARDIOGRAM COMPLETE
Area-P 1/2: 1.27 cm2
MV VTI: 0.66 cm2
S' Lateral: 5.3 cm

## 2020-05-31 LAB — POCT INR: INR: 1.5 — AB (ref 2.0–3.0)

## 2020-05-31 MED ORDER — TRAZODONE HCL 100 MG PO TABS: 100.0000 mg | ORAL_TABLET | Freq: Every day | ORAL | 1 refills | Status: DC

## 2020-05-31 MED ORDER — ALBUTEROL SULFATE HFA 108 (90 BASE) MCG/ACT IN AERS: 1.0000 | INHALATION_SPRAY | Freq: Four times a day (QID) | RESPIRATORY_TRACT | 2 refills | Status: DC | PRN

## 2020-05-31 MED ORDER — WARFARIN SODIUM 5 MG PO TABS: ORAL_TABLET | ORAL | 1 refills | Status: DC

## 2020-05-31 MED ORDER — CARVEDILOL 6.25 MG PO TABS: 6.2500 mg | ORAL_TABLET | Freq: Two times a day (BID) | ORAL | 1 refills | Status: DC

## 2020-05-31 MED ORDER — EZETIMIBE 10 MG PO TABS: 10.0000 mg | ORAL_TABLET | Freq: Every day | ORAL | 1 refills | Status: DC

## 2020-05-31 MED ORDER — CHOLESTYRAMINE 4 G PO PACK: 4.0000 g | PACK | Freq: Three times a day (TID) | ORAL | 1 refills | Status: AC

## 2020-05-31 NOTE — Assessment & Plan Note (Signed)
Pt has gained 4 pounds since last visit and has LE edema/DOE.  Recommended that she increase her lasix to 40mg  bid for 3 days.  She was initially scheduled to see cardiology on Monday 4/25, however I looked in the system and it was cancelled due to an insurance problem. She will head upstairs after her visit to reschedule. Advised her that if she is unable to get in early next week with cardiology- to schedule a follow up visit with me so I can see how she is doing on the higher dose of lasix.

## 2020-05-31 NOTE — Patient Instructions (Addendum)
Please increase lasix to 40mg  twice daily until you see cardiology. Please restart 5mg  daily except 10mg  on Tuesday, Thursday and Saturday.

## 2020-05-31 NOTE — Assessment & Plan Note (Signed)
INR was elevated following treatment with azithromycin and she has been holding coumadin. Advised pt to resume previous coumadin dosing (5mg  daily except 10mg  on Tuesday, Thursday and Saturday) and return for INR check in 1 week.

## 2020-05-31 NOTE — Progress Notes (Signed)
  Echocardiogram 2D Echocardiogram has been performed.  Annette Powers 05/31/2020, 12:56 PM

## 2020-05-31 NOTE — Progress Notes (Signed)
Subjective:   By signing my name below, I, Annette Powers, attest that this documentation has been prepared under the direction and in the presence of Annette Alar, NP. 05/31/2020    Patient ID: Annette Powers, female    DOB: 1935/07/19, 85 y.o.   MRN: [id number]  No chief complaint on file.   HPI Patient is in today for a office visit. She complains of having occasional nausea, cough, white sputum production, and shortness of breath. She stopped taking 50 mg primidone daily PO for managing her seizures. She notes she still continues 100 mg trazodone daily PO.  Weight- She has gained 4 lb since her last visit. Wt Readings from Last 3 Encounters:  05/31/20 179 lb (81.2 kg)  05/27/20 175 lb (79.4 kg)  05/19/20 175 lb (79.4 kg)   CHF- She is taking 40 mg lasix daily PO but does not find much relief. Hypertension- She is requesting for a refill of 3.125 mg carvedilol daily PO. Hyperlipidemia- She is requesting to have a refill of 10 mg zetia daily PO.   Past Medical History:  Diagnosis Date  . Abdominal bruit   . Acute blood loss anemia 03/09/2017  . Anemia, unspecified 04/23/2008   Formatting of this note might be different from the original. Work up done 04/2008 showed normal Vit S06, folic acid, iron studies and no monoclonal protein. Normal sed rate. Most likely anemia due to renal insufficiency. Bone marrow biopsy performed 01/06/2011 showed variably cellular marrow with normocellular and hypocellular areas with no dysplastic changes. Adequate megakaryocytes. No malignanc  . Atrial fibrillation ([place])   . Atrial fibrillation, chronic ([place]) 03/09/2017  . Bradycardia 04/09/2020   Formatting of this note might be different from the original. Pacemaker implanted   Last Assessment & Plan:  Formatting of this note might be different from the original. S/p pacemaker  . Bronchiectasis without complication ([place]) 04/09/6008  . Bronchitis   . CHF (congestive heart failure) ([place])   .  Chronic renal failure, stage 3 (moderate) ([place]) 02/19/2020  . Congestive heart failure ([place]) 02/19/2020  . Cough 11/01/2012  . CVA (cerebral vascular accident) ([place]) 11/05/2009  . Dyspnea on exertion 05/16/2010   Last Assessment & Plan:  Formatting of this note might be different from the original. Stable at this time.  . Encounter for therapeutic drug level monitoring 12/21/2017  . Essential hypertension 03/09/2017  . Family history of malignant neoplasm of breast 05/26/2008   Formatting of this note might be different from the original. Mother died at age 59, sister diagnosed at age 57 with breast cancer. Maternal aunt with abdominal cancer(per patient not ovarian). Maternal grandmother died age 4 of CVA. Declined genetic councelling  . GI bleed 03/09/2017  . HLD (hyperlipidemia) 03/09/2017  . Hyperlipidemia   . Hypertension   . Hypothyroidism 03/09/2017  . Liver cirrhosis ([place])   . Long term (current) use of anticoagulants 10/30/2013   Last Assessment & Plan:  Formatting of this note is different from the original. No bleeding issues. Continue with warfarin  INR, Capillary (no units)  Date Value  06/16/2019 2.0  . LV dysfunction 07/05/2012   Formatting of this note might be different from the original. EF 40-45% per echo 05/2012  Echo 01/2015 LVEF 44%  06/2016 LVEF 45%   Last Assessment & Plan:  Formatting of this note might be different from the original. LVEF 45% per echo 06/2016. Currently asymptomatic. Will continue observation with a repeat echo. Remain on BB, aldactone and ARB regimen.  Marland Kitchen  Macular degeneration   . MAI (mycobacterium avium-intracellulare) ([place])   . Malignant neoplasm of main bronchus ([place]) 06/07/2018  . Mycobacterium avium infection ([place]) 02/19/2020  . Other cirrhosis of liver ([place]) 02/19/2020  . Pacemaker 02/19/2020  . Primary thrombocytopenia ([place]) 04/23/2008   Formatting of this note might be different from the original. Thrombocytopenia documented since 05/02/03, mild, chronic.  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 US of the abdomen 01/2008 showed spleen measuring 16 cm.. Adequate megakaryocytes noted on bone marrow biopsy 12/2010  . 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 Struggling with nerve injury resulting in dominant L hand weakness and numbness. Quite depressed about the situation. Encouraged her to continue with therapy.  . Pulmonary hypertension ([place]) 02/19/2020  . Rectal bleeding 03/09/2017  . Splenomegaly 05/26/2008   Formatting of this note might be different from the original. Noted on 01/2008 renal ultrasound measuring 16X6.5 cm  . Status post mitral valve replacement with bioprosthetic valve 04/09/2020   Formatting of this note is different from the original. 11/2013 Barb Merino) Redo sternotomy, redo mitral valve surgery consisting of mitral valve replacement with a size 27 Hancock II porcine bioprosthesis, tricuspid valve repair utilizing a size 36 Carpentier-Edwards Classic tricuspid annuloplasty ring through a redo sternotomy.  06/22/16 Echocardiogram Mildly decreased left ventricular systolic func  . Thyroid disease   . Tremor 02/19/2020  . Vitamin D deficiency 02/19/2020    Past Surgical History:  Procedure Laterality Date  . ABDOMINAL HYSTERECTOMY  1985  . CHOLECYSTECTOMY  1969  . MITRAL VALVE REPLACEMENT  11/15/2013   bioprosthetic valve (redo)  . MITRAL VALVE REPLACEMENT  1998  . PACEMAKER INSERTION  07/07/2018  . TRICUSPID VALVE SURGERY  11/2013   repair  . VASCULAR SURGERY Left    had repair of aneurysm left arm following removal of central line    Family History  Problem Relation Age of Onset  . Breast cancer Mother        died at age 32  . Hyperlipidemia Sister   . Diabetes Mellitus I Daughter 88    Social History   Socioeconomic History  . Marital status: Widowed    Spouse name: Not on file  . Number of children: Not on file   . Years of education: Not on file  . Highest education level: Not on file  Occupational History  . Not on file  Tobacco Use  . Smoking status: Never Smoker  . Smokeless tobacco: Never Used  Vaping Use  . Vaping Use: Never used  Substance and Sexual Activity  . Alcohol use: Yes    Comment: rare alcohol  . Drug use: No  . Sexual activity: Not Currently  Other Topics Concern  . Not on file  Social History Narrative   Husband died in 02-May-2019   Retired respiratory therapist   One daughter died at age 42   One living son, lives with im.   She belongs to the [employer]   No pets   Social Determinants of Health   Financial Resource Strain: Not on file  Food Insecurity: Not on file  Transportation Needs: Not on file  Physical Activity: Not on file  Stress: Not on file  Social Connections: Not on file  Intimate Partner Violence: Not on file    Outpatient Medications Prior to Visit  Medication Sig Dispense Refill  . albuterol (PROVENTIL HFA;VENTOLIN HFA) 108 (90 Base) MCG/ACT inhaler  Inhale 1 puff into the lungs every 6 (six) hours as needed for wheezing or shortness of breath.    . carvedilol (COREG) 3.125 MG tablet Take 2 tablets (6.25 mg total) by mouth 2 (two) times daily with a meal.    . Cholecalciferol 5000 units TABS Take 5,000 Units by mouth daily.    . cholestyramine (QUESTRAN) 4 GM/DOSE powder Take 4 g by mouth as needed.     . ezetimibe (ZETIA) 10 MG tablet Take 10 mg by mouth daily.    . furosemide (LASIX) 40 MG tablet Take 1 tablet (40 mg total) by mouth daily. 90 tablet 3  . irbesartan (AVAPRO) 150 MG tablet Take 1 tablet (150 mg total) by mouth once for 1 dose. 90 tablet 1  . levothyroxine (SYNTHROID) 200 MCG tablet Take 1 tablet (200 mcg total) by mouth daily. 90 tablet 1  . Multiple Vitamins-Minerals (PRESERVISION AREDS PO) Take by mouth 2 (two) times daily.    . primidone (MYSOLINE) 50 MG tablet Take 150 mg by mouth at bedtime.    Marland Kitchen  spironolactone (ALDACTONE) 50 MG tablet Take 1 tablet (50 mg total) by mouth daily. 90 tablet 1  . traZODone (DESYREL) 100 MG tablet Take 100 mg by mouth at bedtime.    Marland Kitchen warfarin (COUMADIN) 5 MG tablet 12m daily except 174mon Tuesday, Thursday and saturday 30 tablet 3   No facility-administered medications prior to visit.    No Known Allergies  ROS See HPI    Objective:    Physical Exam Cardiovascular:     Rate and Rhythm: Normal rate. Rhythm irregularly irregular.  Pulmonary:     Effort: Pulmonary effort is normal.     Breath sounds: Normal breath sounds.  Musculoskeletal:     Right lower leg: 3+ Edema present.     Left lower leg: 3+ Edema present.  Psychiatric:        Attention and Perception: Attention normal.        Speech: Speech normal.        Behavior: Behavior normal.        Thought Content: Thought content normal.        Cognition and Memory: Cognition normal.     Comments: Seems irritable today/frustrated     There were no vitals taken for this visit. Wt Readings from Last 3 Encounters:  05/27/20 175 lb (79.4 kg)  05/19/20 175 lb (79.4 kg)  04/17/20 175 lb (79.4 kg)    Diabetic Foot Exam - Simple   No data filed    Lab Results  Component Value Date   WBC 3.8 (L) 05/27/2020   HGB 10.1 (L) 05/27/2020   HCT 30.4 (L) 05/27/2020   PLT 92.0 (L) 05/27/2020   GLUCOSE 94 05/27/2020   CHOL 148 02/19/2020   TRIG 103.0 02/19/2020   HDL 44.60 02/19/2020   LDLCALC 83 02/19/2020   ALT 12 02/19/2020   AST 16 02/19/2020   NA 140 05/27/2020   K 4.8 05/27/2020   CL 109 05/27/2020   CREATININE 1.50 (H) 05/27/2020   BUN 26 (H) 05/27/2020   CO2 24 05/27/2020   TSH 31.96 (H) 05/28/2020   INR 6.1 (HH) 05/27/2020    Lab Results  Component Value Date   TSH 31.96 (H) 05/28/2020   Lab Results  Component Value Date   WBC 3.8 (L) 05/27/2020   HGB 10.1 (L) 05/27/2020   HCT 30.4 (L) 05/27/2020   MCV 89.9 05/27/2020   PLT 92.0 (L) 05/27/2020  Lab Results   Component Value Date   NA 140 05/27/2020   K 4.8 05/27/2020   CO2 24 05/27/2020   GLUCOSE 94 05/27/2020   BUN 26 (H) 05/27/2020   CREATININE 1.50 (H) 05/27/2020   BILITOT 0.4 02/19/2020   ALKPHOS 98 02/19/2020   AST 16 02/19/2020   ALT 12 02/19/2020   PROT 6.0 02/19/2020   ALBUMIN 4.0 02/19/2020   CALCIUM 8.9 05/27/2020   ANIONGAP 7 03/10/2017   GFR 32.12 (L) 05/27/2020   Lab Results  Component Value Date   CHOL 148 02/19/2020   Lab Results  Component Value Date   HDL 44.60 02/19/2020   Lab Results  Component Value Date   LDLCALC 83 02/19/2020   Lab Results  Component Value Date   TRIG 103.0 02/19/2020   Lab Results  Component Value Date   CHOLHDL 3 02/19/2020   No results found for: HGBA1C     Assessment & Plan:   Problem List Items Addressed This Visit   None      No orders of the defined types were placed in this encounter.   I, Annette [place], personally preformed the services described in this documentation.  All medical record entries made by the scribe were at my direction and in my presence.  I have reviewed the chart and discharge instructions (if applicable) and agree that the record reflects my personal performance and is accurate and complete. 05/31/2020   I,Annette Powers,acting as a scribe for Nance Pear, NP.,have documented all relevant documentation on the behalf of Nance Pear, NP,as directed by  Nance Pear, NP while in the presence of Nance Pear, NP.   Annette Hayden, NP, have reviewed all documentation for this visit. The documentation on 05/31/20 for the exam, diagnosis, procedures, and orders are all accurate and complete.

## 2020-05-31 NOTE — Assessment & Plan Note (Signed)
HR was 103 last visit, now 85 bpm following increase in coreg from 3.125 to 6.25 mg bid.  Continue at 6.25mg  bid.  Coumadin instructions as below.

## 2020-06-02 ENCOUNTER — Telehealth: Payer: Self-pay | Admitting: Family

## 2020-06-02 MED ORDER — LEVOTHYROXINE SODIUM 25 MCG PO TABS: 25.0000 ug | ORAL_TABLET | Freq: Every day | ORAL | 3 refills | Status: DC

## 2020-06-02 NOTE — Telephone Encounter (Signed)
Please contact pt and ask her how long she has been taking the 25 mcg synthroid in addition to the 200 mcg synthroid?  She should take both on an empty stomach first thing in the AM and wait 30 minutes to eat or take other medication. Let's repeat tsh in 4 weeks, dx hypothyroid.

## 2020-06-03 ENCOUNTER — Ambulatory Visit: Admitting: Cardiology

## 2020-06-03 ENCOUNTER — Other Ambulatory Visit: Payer: Self-pay

## 2020-06-03 DIAGNOSIS — E039 Hypothyroidism, unspecified: Secondary | ICD-10-CM

## 2020-06-03 NOTE — Telephone Encounter (Signed)
Called but no answer and unable to leave message

## 2020-06-03 NOTE — Telephone Encounter (Signed)
Patient has been taking 200 mcg only, she was advised to start taking 225 daily 30 minutes before eating. She verbalized understanding. She will call back to set up lab appointment.

## 2020-06-07 ENCOUNTER — Other Ambulatory Visit: Payer: Self-pay

## 2020-06-07 ENCOUNTER — Encounter: Payer: Self-pay | Admitting: Cardiology

## 2020-06-07 ENCOUNTER — Ambulatory Visit (INDEPENDENT_AMBULATORY_CARE_PROVIDER_SITE_OTHER): Payer: Medicare Other | Admitting: Cardiology

## 2020-06-07 VITALS — BP 102/50 | HR 83 | Ht 67.0 in | Wt 169.0 lb

## 2020-06-07 DIAGNOSIS — I272 Pulmonary hypertension, unspecified: Secondary | ICD-10-CM | POA: Diagnosis not present

## 2020-06-07 DIAGNOSIS — I519 Heart disease, unspecified: Secondary | ICD-10-CM

## 2020-06-07 DIAGNOSIS — I509 Heart failure, unspecified: Secondary | ICD-10-CM | POA: Diagnosis not present

## 2020-06-07 DIAGNOSIS — I10 Essential (primary) hypertension: Secondary | ICD-10-CM

## 2020-06-07 DIAGNOSIS — R0609 Other forms of dyspnea: Secondary | ICD-10-CM

## 2020-06-07 DIAGNOSIS — Z950 Presence of cardiac pacemaker: Secondary | ICD-10-CM

## 2020-06-07 DIAGNOSIS — R0600 Dyspnea, unspecified: Secondary | ICD-10-CM

## 2020-06-07 MED ORDER — ENTRESTO 24-26 MG PO TABS: 1.0000 | ORAL_TABLET | Freq: Two times a day (BID) | ORAL | 2 refills | Status: DC

## 2020-06-07 NOTE — Patient Instructions (Signed)
Medication Instructions:  Your physician has recommended you make the following change in your medication:   STOP: Irbesartan   START: Entresto 24/26 mg twice daily   *If you need a refill on your cardiac medications before your next appointment, please call your pharmacy*   Lab Work: Your physician recommends that you return for lab work 1 week : bmp   If you have labs (blood work) drawn today and your tests are completely normal, you will receive your results only by: Marland Kitchen MyChart Message (if you have MyChart) OR . A paper copy in the mail If you have any lab test that is abnormal or we need to change your treatment, we will call you to review the results.   Testing/Procedures: None   Follow-Up: At [facility], you and your health needs are our priority.  As part of our continuing mission to provide you with exceptional heart care, we have created designated Provider Care Teams.  These Care Teams include your primary Cardiologist (physician) and Advanced Practice Providers (APPs -  Physician Assistants and Nurse Practitioners) who all work together to provide you with the care you need, when you need it.  We recommend signing up for the patient portal called "MyChart".  Sign up information is provided on this After Visit Summary.  MyChart is used to connect with patients for Virtual Visits (Telemedicine).  Patients are able to view lab/test results, encounter notes, upcoming appointments, etc.  Non-urgent messages can be sent to your provider as well.   To learn more about what you can do with MyChart, go to [email or web address].    Your next appointment:   3 week(s)  The format for your next appointment:   In Person  Provider:   Jenne Campus, MD   Other Instructions  Sacubitril; Valsartan Oral Tablets What is this medicine? SACUBITRIL; VALSARTAN (sak UE bi tril; val SAR tan) is a combination of a neprilysin inhibitor and a an angiotensin II receptor blocker. It  treats heart failure. This medicine may be used for other purposes; ask your health care provider or pharmacist if you have questions. COMMON BRAND NAME(S): Entresto What should I tell my health care provider before I take this medicine? They need to know if you have any of these conditions:  diabetes and take a medicine that contains aliskiren  high levels of potassium in the blood  kidney disease  liver disease  low blood pressure  an unusual or allergic reaction to sacubitril; valsartan, drugs called angiotensin converting enzyme (ACE) inhibitors, angiotensin II receptor blockers (ARBs), other medicines, foods, dyes, or preservatives  pregnant or trying to get pregnant  breast-feeding How should I use this medicine? Take this medicine by mouth. Take it as directed on the prescription label at the same time every day. You can take it with or without food. If it upsets your stomach, take it with food. Keep taking it unless your health care provider tells you to stop. Talk to your health care provider about the use of this drug in children. While it may be prescribed for children as young as 1 for selected conditions, precautions do apply. Overdosage: If you think you have taken too much of this medicine contact a poison control center or emergency room at once. NOTE: This medicine is only for you. Do not share this medicine with others. What if I miss a dose? If you miss a dose, take it as soon as you can. If it is almost time for your next  dose, take only that dose. Do not take double or extra doses. What may interact with this medicine? Do not take this medicine with any of the following medicines:  aliskiren if you have diabetes  angiotensin-converting enzyme (ACE) inhibitors, like benazepril, captopril, enalapril, fosinopril, lisinopril, or ramipril  tranylcypromine This medicine may also interact with the following medicines:  angiotensin II receptor blockers (ARBs) like  azilsartan, candesartan, eprosartan, irbesartan, losartan, olmesartan, telmisartan, or valsartan  celecoxib  lithium  NSAIDS, medicines for pain and inflammation, like ibuprofen or naproxen  potassium-sparing diuretics like amiloride, spironolactone, and triamterene  potassium supplements This list may not describe all possible interactions. Give your health care provider a list of all the medicines, herbs, non-prescription drugs, or dietary supplements you use. Also tell them if you smoke, drink alcohol, or use illegal drugs. Some items may interact with your medicine. What should I watch for while using this medicine? Tell your doctor or health care provider if your symptoms do not start to get better or if they get worse. Do not become pregnant while taking this medicine. Women should inform their health care provider if they wish to become pregnant or think they might be pregnant. There is a potential for serious harm to an unborn child. Talk to your health care provider for more information. You may get drowsy or dizzy. Do not drive, use machinery, or do anything that needs mental alertness until you know how this medicine affects you. Do not stand or sit up quickly, especially if you are an older patient. This reduces the risk of dizzy or fainting spells. Alcohol may interfere with the effects of this medicine. Avoid alcoholic drinks. Avoid salt substitutes unless you are told otherwise by your health care provider. What side effects may I notice from receiving this medicine? Side effects that you should report to your doctor or health care provider as soon as possible:  allergic reactions (skin rash, itching or hives; swelling of the face, lips, or tongue)  high potassium levels (chest pain; fast, irregular heartbeat; muscle weakness)  kidney injury (trouble passing urine or change in the amount of urine)  low blood pressure (dizziness; feeling faint or lightheaded, falls; unusually  weak or tired) Side effects that usually do not require medical attention (report to your doctor or health care provider if they continue or are bothersome):  cough This list may not describe all possible side effects. Call your doctor for medical advice about side effects. You may report side effects to FDA at 1-800-FDA-1088. Where should I keep my medicine? Keep out of the reach of children and pets. Store at room temperature between 20 and 25 degrees C (68 and 77 degrees F). Protect from moisture. Keep the container tightly closed. Get rid of any unused medicine after the expiration date. To get rid of medicines that are no longer needed or have expired:  Take the medicine to a take-back program. Check with your pharmacy or law enforcement to find a location.  If you cannot return the medicine, check the label or package insert to see if the medicine should be thrown out in the garbage or flushed down the toilet. If you are not sure, ask your health care provider. If it is safe to put it in the trash, empty the medicine out of the container. Mix the medicine with cat litter, dirt, coffee grounds, or other unwanted substance. Seal the mixture in a bag or container. Put it in the trash. NOTE: This sheet is a  summary. It may not cover all possible information. If you have questions about this medicine, talk to your doctor, pharmacist, or health care provider.  2021 Elsevier/Gold Standard (2019-04-03 11:23:32)

## 2020-06-07 NOTE — Addendum Note (Signed)
Addended by: Senaida Ores on: 06/07/2020 11:39 AM   Modules accepted: Orders

## 2020-06-07 NOTE — Progress Notes (Signed)
Cardiology Office Note:    Date:  06/07/2020   ID:  Annette Powers, DOB 10/01/1935, MRN [id number]  PCP:  O'Sullivan, Melissa, NP  Cardiologist:  Robert Krasowski, MD    Referring MD: O'Sullivan, Melissa, NP   Chief Complaint  Patient presents with  . Results  I was sick but I am doing better  History of Present Illness:    Annette Powers is a 85 y.o. female  who is being seen today for the evaluation of multiple cardiac issues at the request of O'Sullivan, Melissa, NP.  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  She did have mitral valve replacement done many years ago and then in 2015 October she required 3 intervention for tricuspid valve regurgitation.  At that time she did have repair of tricuspid valve with annuloplasty ring with redo sternotomy.  She does have permanent atrial fibrillation, pulmonary hypertension, single-chamber pacemaker, she does have left ventricle dysfunction.  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  She also had history of CVA.  She did have a cardiac catheterization before her surgery in 2015 which showed mild diffuse atherosclerotic changes but no critical stenosis identified.  She relocated to [place] to join her son after her significant other of 35 years past.  She is coming from [place]. She comes today to my office for follow-up.  She was sick she did have some respiratory tract infection and required some medications get better but still not up to the park tired fatigue short of breath but no chest pain tightness squeezing pressure burning chest no proximal nocturnal dyspnea.  She still gets some swelling of lower extremities which is chronic.  Past Medical History:  Diagnosis Date  . Abdominal bruit   . Acute blood loss anemia 03/09/2017  . Anemia, unspecified 04/23/2008   Formatting of this note might be different  from the original. Work up done 04/2008 showed normal Vit B12, folic acid, iron studies and no monoclonal protein. Normal sed rate. Most likely anemia due to renal insufficiency. Bone marrow biopsy performed 01/06/2011 showed variably cellular marrow with normocellular and hypocellular areas with no dysplastic changes. Adequate megakaryocytes. No malignanc  . Atrial fibrillation (HCC)   . Atrial fibrillation, chronic (HCC) 03/09/2017  . Bradycardia 04/09/2020   Formatting of this note might be different from the original. Pacemaker implanted   Last Assessment & Plan:  Formatting of this note might be different from the original. S/p pacemaker  . Bronchiectasis without complication (HCC) 02/19/2020  . Bronchitis   . CHF (congestive heart failure) (HCC)   . Chronic renal failure, stage 3 (moderate) (HCC) 02/19/2020  . Congestive heart failure (HCC) 02/19/2020  . Cough 11/01/2012  . CVA (cerebral vascular accident) (HCC) 11/05/2009  . Dyspnea on exertion 05/16/2010   Last Assessment & Plan:  Formatting of this note might be different from the original. Stable at this time.  . Encounter for therapeutic drug level monitoring 12/21/2017  . Essential hypertension 03/09/2017  . Family history of malignant neoplasm of breast 05/26/2008   Formatting of this note might be different from the original. Mother died at age 52, sister diagnosed at age 60 with breast cancer. Maternal aunt with abdominal cancer(per patient not ovarian). Maternal grandmother died age 49 of CVA. Declined genetic councelling  . GI bleed 03/09/2017  . HLD (hyperlipidemia) 03/09/2017  . Hyperlipidemia   . Hypertension   . Hypothyroidism 03/09/2017  .   Liver cirrhosis (HCC)   . Long term (current) use of anticoagulants 10/30/2013   Last Assessment & Plan:  Formatting of this note is different from the original. No bleeding issues. Continue with warfarin  INR, Capillary (no units)  Date Value  06/16/2019 2.0  . LV dysfunction 07/05/2012   Formatting  of this note might be different from the original. EF 40-45% per echo 05/2012  Echo 01/2015 LVEF 44%  06/2016 LVEF 45%   Last Assessment & Plan:  Formatting of this note might be different from the original. LVEF 45% per echo 06/2016. Currently asymptomatic. Will continue observation with a repeat echo. Remain on BB, aldactone and ARB regimen.  . Macular degeneration   . MAI (mycobacterium avium-intracellulare) (HCC)   . Malignant neoplasm of main bronchus (HCC) 06/07/2018  . Mycobacterium avium infection (HCC) 02/19/2020  . Other cirrhosis of liver (HCC) 02/19/2020  . Pacemaker 02/19/2020  . Primary thrombocytopenia (HCC) 04/23/2008   Formatting of this note might be different from the original. Thrombocytopenia documented since 2005, mild, chronic. 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 US of the abdomen 01/2008 showed spleen measuring 16 cm.. Adequate megakaryocytes noted on bone marrow biopsy 12/2010  . 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 Struggling with nerve injury resulting in dominant L hand weakness and numbness. Quite depressed about the situation. Encouraged her to continue with therapy.  . Pulmonary hypertension (HCC) 02/19/2020  . Rectal bleeding 03/09/2017  . Splenomegaly 05/26/2008   Formatting of this note might be different from the original. Noted on 01/2008 renal ultrasound measuring 16X6.5 cm  . Status post mitral valve replacement with bioprosthetic valve 04/09/2020   Formatting of this note is different from the original. 11/2013 ([facility]) Redo sternotomy, redo mitral valve surgery consisting of mitral valve replacement with a size 27 Hancock II porcine bioprosthesis, tricuspid valve repair utilizing a size 36 Carpentier-Edwards Classic tricuspid annuloplasty ring through a redo sternotomy.  06/22/16 Echocardiogram Mildly decreased left ventricular systolic func  . Thyroid disease    . Tremor 02/19/2020  . Vitamin D deficiency 02/19/2020    Past Surgical History:  Procedure Laterality Date  . ABDOMINAL HYSTERECTOMY  1985  . CHOLECYSTECTOMY  1969  . MITRAL VALVE REPLACEMENT  11/15/2013   bioprosthetic valve (redo)  . MITRAL VALVE REPLACEMENT  1998  . PACEMAKER INSERTION  07/07/2018  . TRICUSPID VALVE SURGERY  11/2013   repair  . VASCULAR SURGERY Left    had repair of aneurysm left arm following removal of central line    Current Medications: Current Meds  Medication Sig  . carvedilol (COREG) 6.25 MG tablet Take 1 tablet (6.25 mg total) by mouth 2 (two) times daily with a meal.  . Cholecalciferol 5000 units TABS Take 5,000 Units by mouth daily.  . cholestyramine (QUESTRAN) 4 g packet Take 1 packet (4 g total) by mouth 3 (three) times daily with meals. (Patient taking differently: Take 4 g by mouth as needed (IBS).)  . cholestyramine (QUESTRAN) 4 GM/DOSE powder Take 4 g by mouth as needed.   . ezetimibe (ZETIA) 10 MG tablet Take 1 tablet (10 mg total) by mouth daily.  . furosemide (LASIX) 40 MG tablet Take 1 tablet (40 mg total) by mouth daily.  . irbesartan (AVAPRO) 150 MG tablet Take 1 tablet (150 mg total) by mouth once for 1 dose. (Patient taking differently: Take 150 mg by mouth daily.)  . levothyroxine (  SYNTHROID) 200 MCG tablet Take 1 tablet (200 mcg total) by mouth daily.  . levothyroxine (SYNTHROID) 25 MCG tablet Take 1 tablet (25 mcg total) by mouth daily.  . Multiple Vitamins-Minerals (PRESERVISION AREDS PO) Take 1 tablet by mouth 2 (two) times daily. Unknown strength  . spironolactone (ALDACTONE) 50 MG tablet Take 1 tablet (50 mg total) by mouth daily.  . traZODone (DESYREL) 100 MG tablet Take 1 tablet (100 mg total) by mouth at bedtime.  . warfarin (COUMADIN) 5 MG tablet 5mg daily except 10mg on Tuesday, Thursday and saturday (Patient taking differently: Take 5 mg by mouth as directed. 5mg daily except 10mg on Tuesday, Thursday and saturday)      Allergies:   Patient has no known allergies.   Social History   Socioeconomic History  . Marital status: Widowed    Spouse name: Not on file  . Number of children: Not on file  . Years of education: Not on file  . Highest education level: Not on file  Occupational History  . Not on file  Tobacco Use  . Smoking status: Never Smoker  . Smokeless tobacco: Never Used  Vaping Use  . Vaping Use: Never used  Substance and Sexual Activity  . Alcohol use: Yes    Comment: rare alcohol  . Drug use: No  . Sexual activity: Not Currently  Other Topics Concern  . Not on file  Social History Narrative   Husband died in 2021   Retired respiratory therapist   One daughter died at age 27   One living son, lives with im.   She belongs to the [employer]   No pets   Social Determinants of Health   Financial Resource Strain: Not on file  Food Insecurity: Not on file  Transportation Needs: Not on file  Physical Activity: Not on file  Stress: Not on file  Social Connections: Not on file     Family History: The patient's family history includes Breast cancer in her mother; Diabetes Mellitus I (age of onset: 27) in her daughter; Hyperlipidemia in her sister. ROS:   Please see the history of present illness.    All 14 point review of systems negative except as described per history of present illness  EKGs/Labs/Other Studies Reviewed:      Recent Labs: 02/19/2020: ALT 12 04/17/2020: NT-Pro BNP 9,430 05/27/2020: BUN 26; Creatinine, Ser 1.50; Hemoglobin 10.1; Platelets 92.0; Potassium 4.8; Sodium 140 05/28/2020: TSH 31.96  Recent Lipid Panel    Component Value Date/Time   CHOL 148 02/19/2020 1104   TRIG 103.0 02/19/2020 1104   HDL 44.60 02/19/2020 1104   CHOLHDL 3 02/19/2020 1104   VLDL 20.6 02/19/2020 1104   LDLCALC 83 02/19/2020 1104    Physical Exam:    VS:  BP (!) 102/50 (BP Location: Right Arm, Patient Position: Sitting)   Pulse 83   Ht 5' 7" (1.702 m)    Wt 169 lb (76.7 kg)   SpO2 96%   BMI 26.47 kg/m     Wt Readings from Last 3 Encounters:  06/07/20 169 lb (76.7 kg)  05/31/20 179 lb (81.2 kg)  05/27/20 175 lb (79.4 kg)     GEN:  Well nourished, well developed in no acute distress HEENT: Normal NECK: No JVD; No carotid bruits LYMPHATICS: No lymphadenopathy CARDIAC: Irregular irregular, tones are distant, no murmurs, no rubs, no gallops RESPIRATORY:  Clear to auscultation without rales, wheezing or rhonchi  ABDOMEN: Soft, non-tender, non-distended MUSCULOSKELETAL:  No edema; No   deformity  SKIN: Warm and dry LOWER EXTREMITIES: no swelling NEUROLOGIC:  Alert and oriented x 3 PSYCHIATRIC:  Normal affect   ASSESSMENT:    1. Congestive heart failure, unspecified HF chronicity, unspecified heart failure type (HCC)   2. Pulmonary hypertension (HCC)   3. LV dysfunction   4. Essential hypertension   5. Dyspnea on exertion   6. Pacemaker    PLAN:    In order of problems listed above:  1. Congestive heart failure.  I did review her echocardiogram that we did showed diminished ejection fraction 30 to 35%.  We did talk about options for the situation.  Recently her dose of carvedilol has been increased that was a good move.  I also would like to change her Avapro to Entresto 24/26 twice daily.  We will check Chem-7 within the next week.  She does have some baseline kidney dysfunction.  In the meantime we will continue with rest of the medication.  I will not alter any of her diuretic. 2. Pulmonary hypertension not at present echocardiogram showed pulmonary pressure 52 mmHg she is just about stable. 3. Pacemaker present followed by our EP team. 4. Atrial fibrillation rate appears to be controlled right now it was not required higher dose of beta-blocker which also help with cardiomyopathy.  She is on anticoagulant Coumadin which I will continue.   Medication Adjustments/Labs and Tests Ordered: Current medicines are reviewed at length  with the patient today.  Concerns regarding medicines are outlined above.  No orders of the defined types were placed in this encounter.  Medication changes: No orders of the defined types were placed in this encounter.   Signed, Robert J. Krasowski, MD, FACC 06/07/2020 11:20 AM    [facility] 

## 2020-06-10 ENCOUNTER — Encounter (HOSPITAL_BASED_OUTPATIENT_CLINIC_OR_DEPARTMENT_OTHER): Payer: Self-pay | Admitting: *Deleted

## 2020-06-10 ENCOUNTER — Other Ambulatory Visit: Payer: Self-pay

## 2020-06-10 ENCOUNTER — Emergency Department (HOSPITAL_BASED_OUTPATIENT_CLINIC_OR_DEPARTMENT_OTHER): Payer: Medicare Other

## 2020-06-10 ENCOUNTER — Emergency Department (HOSPITAL_BASED_OUTPATIENT_CLINIC_OR_DEPARTMENT_OTHER)
Admission: EM | Admit: 2020-06-10 | Discharge: 2020-06-10 | Disposition: A | Discharge status: Home / Self Care | Payer: Medicare Other | Attending: Emergency Medicine | Admitting: Emergency Medicine

## 2020-06-10 DIAGNOSIS — Z85118 Personal history of other malignant neoplasm of bronchus and lung: Secondary | ICD-10-CM | POA: Insufficient documentation

## 2020-06-10 DIAGNOSIS — I130 Hypertensive heart and chronic kidney disease with heart failure and stage 1 through stage 4 chronic kidney disease, or unspecified chronic kidney disease: Secondary | ICD-10-CM | POA: Diagnosis not present

## 2020-06-10 DIAGNOSIS — Z950 Presence of cardiac pacemaker: Secondary | ICD-10-CM | POA: Diagnosis not present

## 2020-06-10 DIAGNOSIS — E039 Hypothyroidism, unspecified: Secondary | ICD-10-CM | POA: Diagnosis not present

## 2020-06-10 DIAGNOSIS — Z20822 Contact with and (suspected) exposure to covid-19: Secondary | ICD-10-CM | POA: Insufficient documentation

## 2020-06-10 DIAGNOSIS — Z79899 Other long term (current) drug therapy: Secondary | ICD-10-CM | POA: Diagnosis not present

## 2020-06-10 DIAGNOSIS — N1832 Chronic kidney disease, stage 3b: Secondary | ICD-10-CM | POA: Insufficient documentation

## 2020-06-10 DIAGNOSIS — I4891 Unspecified atrial fibrillation: Secondary | ICD-10-CM | POA: Diagnosis not present

## 2020-06-10 DIAGNOSIS — Z7901 Long term (current) use of anticoagulants: Secondary | ICD-10-CM | POA: Insufficient documentation

## 2020-06-10 DIAGNOSIS — I50813 Acute on chronic right heart failure: Secondary | ICD-10-CM

## 2020-06-10 DIAGNOSIS — R0602 Shortness of breath: Secondary | ICD-10-CM | POA: Diagnosis present

## 2020-06-10 DIAGNOSIS — I509 Heart failure, unspecified: Secondary | ICD-10-CM | POA: Diagnosis not present

## 2020-06-10 LAB — COMPREHENSIVE METABOLIC PANEL
ALT: 14 U/L (ref 0–44)
AST: 20 U/L (ref 15–41)
Albumin: 4.2 g/dL (ref 3.5–5.0)
Alkaline Phosphatase: 95 U/L (ref 38–126)
Anion gap: 9 (ref 5–15)
BUN: 35 mg/dL — ABNORMAL HIGH (ref 8–23)
CO2: 26 mmol/L (ref 22–32)
Calcium: 9.3 mg/dL (ref 8.9–10.3)
Chloride: 103 mmol/L (ref 98–111)
Creatinine, Ser: 1.55 mg/dL — ABNORMAL HIGH (ref 0.44–1.00)
GFR, Estimated: 33 mL/min — ABNORMAL LOW (ref >60)
Glucose, Bld: 107 mg/dL — ABNORMAL HIGH (ref 70–99)
Potassium: 4.1 mmol/L (ref 3.5–5.1)
Sodium: 138 mmol/L (ref 135–145)
Total Bilirubin: 0.5 mg/dL (ref 0.3–1.2)
Total Protein: 7 g/dL (ref 6.5–8.1)

## 2020-06-10 LAB — CBC WITH DIFFERENTIAL/PLATELET
Abs Immature Granulocytes: 0.01 10*3/uL (ref 0.00–0.07)
Basophils Absolute: 0 10*3/uL (ref 0.0–0.1)
Basophils Relative: 1 %
Eosinophils Absolute: 0.2 10*3/uL (ref 0.0–0.5)
Eosinophils Relative: 5 %
HCT: 35.7 % — ABNORMAL LOW (ref 36.0–46.0)
Hemoglobin: 11.3 g/dL — ABNORMAL LOW (ref 12.0–15.0)
Immature Granulocytes: 0 %
Lymphocytes Relative: 26 %
Lymphs Abs: 1 10*3/uL (ref 0.7–4.0)
MCH: 29.8 pg (ref 26.0–34.0)
MCHC: 31.7 g/dL (ref 30.0–36.0)
MCV: 94.2 fL (ref 80.0–100.0)
Monocytes Absolute: 0.5 10*3/uL (ref 0.1–1.0)
Monocytes Relative: 13 %
Neutro Abs: 2.1 10*3/uL (ref 1.7–7.7)
Neutrophils Relative %: 55 %
Platelets: 90 10*3/uL — ABNORMAL LOW (ref 150–400)
RBC: 3.79 MIL/uL — ABNORMAL LOW (ref 3.87–5.11)
RDW: 13.9 % (ref 11.5–15.5)
Smear Review: DECREASED
WBC: 3.9 10*3/uL — ABNORMAL LOW (ref 4.0–10.5)
nRBC: 0 % (ref 0.0–0.2)

## 2020-06-10 LAB — TROPONIN I (HIGH SENSITIVITY)
Troponin I (High Sensitivity): 18 ng/L — ABNORMAL HIGH (ref <18)
Troponin I (High Sensitivity): 17 ng/L (ref <18)

## 2020-06-10 LAB — PROTIME-INR
INR: 3.5 — ABNORMAL HIGH (ref 0.8–1.2)
Prothrombin Time: 34.7 seconds — ABNORMAL HIGH (ref 11.4–15.2)

## 2020-06-10 LAB — LIPASE, BLOOD: Lipase: 52 U/L — ABNORMAL HIGH (ref 11–51)

## 2020-06-10 LAB — BRAIN NATRIURETIC PEPTIDE: B Natriuretic Peptide: 707.8 pg/mL — ABNORMAL HIGH (ref 0.0–100.0)

## 2020-06-10 LAB — RESP PANEL BY RT-PCR (FLU A&B, COVID) ARPGX2
Influenza A by PCR: NEGATIVE
Influenza B by PCR: NEGATIVE
SARS Coronavirus 2 by RT PCR: NEGATIVE

## 2020-06-10 MED ORDER — FUROSEMIDE 10 MG/ML IJ SOLN
40.0000 mg | Freq: Once | INTRAMUSCULAR | Status: AC
  Administered 2020-06-10: 40 mg via INTRAVENOUS
  Filled 2020-06-10: qty 4

## 2020-06-10 NOTE — ED Provider Notes (Signed)
[facility]ERGENCY DEPARTMENT Provider Note   CSN: [id number] Arrival date & time: 06/10/20  1342     History Chief Complaint  Patient presents with  . Cough  . Shortness of Breath    Annette Powers is a 85 y.o. female.  Patient states that she has had weeks of coughing and shortness of breath.  Had her Lasix increased several days ago by cardiologist but she has not made those changes.  Continues to have cough with sputum production.  Thinks that she needs antibiotics for pneumonia.  Is on Coumadin for A. fib.  Denies any smoking history or COPD history.  Does not use inhalers.  The history is provided by the patient.  Shortness of Breath Severity:  Mild Onset quality:  Gradual Timing:  Constant Progression:  Unchanged Chronicity:  New Context: activity (coughing)   Relieved by:  Nothing Worsened by:  Nothing Associated symptoms: cough and sputum production   Associated symptoms: no abdominal pain, no chest pain, no diaphoresis, no ear pain, no fever, no rash, no sore throat and no vomiting   Risk factors: no hx of PE/DVT        Past Medical History:  Diagnosis Date  . Abdominal bruit   . Acute blood loss anemia 03/09/2017  . Anemia, unspecified 04/23/2008   Formatting of this note might be different from the original. Work up done 04/2008 showed normal Vit N23, folic acid, iron studies and no monoclonal protein. Normal sed rate. Most likely anemia due to renal insufficiency. Bone marrow biopsy performed 01/06/2011 showed variably cellular marrow with normocellular and hypocellular areas with no dysplastic changes. Adequate megakaryocytes. No malignanc  . Atrial fibrillation ([place])   . Atrial fibrillation, chronic ([place]) 03/09/2017  . Bradycardia 04/09/2020   Formatting of this note might be different from the original. Pacemaker implanted   Last Assessment & Plan:  Formatting of this note might be different from the original. S/p pacemaker  . Bronchiectasis  without complication ([place]) 5/57/3220  . Bronchitis   . CHF (congestive heart failure) ([place])   . Chronic renal failure, stage 3 (moderate) ([place]) 02/19/2020  . Congestive heart failure ([place]) 02/19/2020  . Cough 11/01/2012  . CVA (cerebral vascular accident) ([place]) 11/05/2009  . Dyspnea on exertion 05/16/2010   Last Assessment & Plan:  Formatting of this note might be different from the original. Stable at this time.  . Encounter for therapeutic drug level monitoring 12/21/2017  . Essential hypertension 03/09/2017  . Family history of malignant neoplasm of breast 05/26/2008   Formatting of this note might be different from the original. Mother died at age 37, sister diagnosed at age 35 with breast cancer. Maternal aunt with abdominal cancer(per patient not ovarian). Maternal grandmother died age 53 of CVA. Declined genetic councelling  . GI bleed 03/09/2017  . HLD (hyperlipidemia) 03/09/2017  . Hyperlipidemia   . Hypertension   . Hypothyroidism 03/09/2017  . Liver cirrhosis ([place])   . Long term (current) use of anticoagulants 10/30/2013   Last Assessment & Plan:  Formatting of this note is different from the original. No bleeding issues. Continue with warfarin  INR, Capillary (no units)  Date Value  06/16/2019 2.0  . LV dysfunction 07/05/2012   Formatting of this note might be different from the original. EF 40-45% per echo 05/2012  Echo 01/2015 LVEF 44%  06/2016 LVEF 45%   Last Assessment & Plan:  Formatting of this note might be different from the original. LVEF 45% per echo  06/2016. Currently asymptomatic. Will continue observation with a repeat echo. Remain on BB, aldactone and ARB regimen.  . Macular degeneration   . MAI (mycobacterium avium-intracellulare) ([place])   . Malignant neoplasm of main bronchus ([place]) 06/07/2018  . Mycobacterium avium infection ([place]) 02/19/2020  . Other cirrhosis of liver ([place]) 02/19/2020  . Pacemaker 02/19/2020  . Primary thrombocytopenia ([place]) 04/23/2008   Formatting of this note  might be different from the original. Thrombocytopenia documented since 2005, mild, chronic. 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 US of the abdomen 01/2008 showed spleen measuring 16 cm.. Adequate megakaryocytes noted on bone marrow biopsy 12/2010  . 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 Struggling with nerve injury resulting in dominant L hand weakness and numbness. Quite depressed about the situation. Encouraged her to continue with therapy.  . Pulmonary hypertension ([place]) 02/19/2020  . Rectal bleeding 03/09/2017  . Splenomegaly 05/26/2008   Formatting of this note might be different from the original. Noted on 01/2008 renal ultrasound measuring 16X6.5 cm  . Status post mitral valve replacement with bioprosthetic valve 04/09/2020   Formatting of this note is different from the original. 11/2013 Barb Merino) Redo sternotomy, redo mitral valve surgery consisting of mitral valve replacement with a size 27 Hancock II porcine bioprosthesis, tricuspid valve repair utilizing a size 36 Carpentier-Edwards Classic tricuspid annuloplasty ring through a redo sternotomy.  06/22/16 Echocardiogram Mildly decreased left ventricular systolic func  . Thyroid disease   . Tremor 02/19/2020  . Vitamin D deficiency 02/19/2020    Patient Active Problem List   Diagnosis Date Noted  . Bradycardia 04/09/2020  . Status post mitral valve replacement with bioprosthetic valve 04/09/2020  . Thyroid disease   . MAI (mycobacterium avium-intracellulare) ([place])   . Macular degeneration   . Liver cirrhosis ([place])   . Hypertension   . Hyperlipidemia   . CHF (congestive heart failure) ([place])   . Bronchitis   . Abdominal bruit   . Mycobacterium avium infection ([place]) 02/19/2020  . Bronchiectasis without complication ([place]) 08/67/6195  . Pulmonary hypertension ([place]) 02/19/2020  . Other cirrhosis of liver ([place]) 02/19/2020  .  Pacemaker 02/19/2020  . Congestive heart failure ([place]) 02/19/2020  . Chronic renal failure, stage 3 (moderate) ([place]) 02/19/2020  . Vitamin D deficiency 02/19/2020  . Tremor 02/19/2020  . Malignant neoplasm of main bronchus ([place]) 06/07/2018  . GI bleed 03/09/2017  . Rectal bleeding 03/09/2017  . Atrial fibrillation, chronic ([place]) 03/09/2017  . Essential hypertension 03/09/2017  . HLD (hyperlipidemia) 03/09/2017  . Hypothyroidism 03/09/2017  . 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 Long term (current) use of anticoagulants 10/30/2013  . Cough 11/01/2012  . LV dysfunction 07/05/2012  . Dyspnea on exertion 05/16/2010  . CVA (cerebral vascular accident) ([place]) 11/05/2009  . Family history of malignant neoplasm of breast 05/26/2008  . Splenomegaly 05/26/2008  . Anemia, unspecified 04/23/2008  . Primary thrombocytopenia ([place]) 04/23/2008    Past Surgical History:  Procedure Laterality Date  . ABDOMINAL HYSTERECTOMY  1985  . CHOLECYSTECTOMY  1969  . MITRAL VALVE REPLACEMENT  11/15/2013   bioprosthetic valve (redo)  . MITRAL VALVE REPLACEMENT  1998  . PACEMAKER INSERTION  07/07/2018  . TRICUSPID VALVE SURGERY  11/2013   repair  . 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     Family History  Problem Relation Age of Onset  . Breast cancer Mother        died at age 53  . Hyperlipidemia Sister   . Diabetes Mellitus I Daughter 21    Social History   Tobacco Use  . Smoking status: Never Smoker  . Smokeless tobacco: Never Used  Vaping Use  . Vaping Use: Never used  Substance Use Topics  . Alcohol use: Yes    Comment: rare alcohol  . Drug use: No    Home Medications Prior to Admission medications   Medication Sig Start Date End Date Taking? Authorizing Provider  carvedilol (COREG) 6.25 MG tablet Take 1 tablet (6.25 mg total) by mouth 2 (two) times daily  with a meal. 05/31/20   [name], NP  Cholecalciferol 5000 units TABS Take 5,000 Units by mouth daily.    [provider]  cholestyramine (QUESTRAN) 4 g packet Take 1 packet (4 g total) by mouth 3 (three) times daily with meals. Patient taking differently: Take 4 g by mouth as needed (IBS). 05/31/20   [name], NP  cholestyramine Lucrezia Starch) 4 GM/DOSE powder Take 4 g by mouth as needed.     [provider]  ezetimibe (ZETIA) 10 MG tablet Take 1 tablet (10 mg total) by mouth daily. 05/31/20   [name], NP  furosemide (LASIX) 40 MG tablet Take 1 tablet (40 mg total) by mouth daily. 04/23/20 07/22/20  Park Liter, MD  levothyroxine (SYNTHROID) 200 MCG tablet Take 1 tablet (200 mcg total) by mouth daily. 05/20/20   [name], NP  levothyroxine (SYNTHROID) 25 MCG tablet Take 1 tablet (25 mcg total) by mouth daily. 06/02/20   [name], NP  Multiple Vitamins-Minerals (PRESERVISION AREDS PO) Take 1 tablet by mouth 2 (two) times daily. Unknown strength    [provider]  sacubitril-valsartan (ENTRESTO) 24-26 MG Take 1 tablet by mouth 2 (two) times daily. 06/07/20   Park Liter, MD  spironolactone (ALDACTONE) 50 MG tablet Take 1 tablet (50 mg total) by mouth daily. 05/20/20   [name], NP  traZODone (DESYREL) 100 MG tablet Take 1 tablet (100 mg total) by mouth at bedtime. 05/31/20   [name], NP  warfarin (COUMADIN) 5 MG tablet 57m daily except 1716mon Tuesday, Thursday and saturday Patient taking differently: Take 5 mg by mouth as directed. 16m26maily except 49m3m Tuesday, Thursday and saturday 05/31/20   O'SuDebbrah Alar    Allergies    Patient has no known allergies.  Review of Systems   Review of Systems  Constitutional: Negative for chills, diaphoresis and fever.  HENT: Negative for ear pain and sore throat.   Eyes: Negative for pain and visual disturbance.  Respiratory: Positive  for cough, sputum production and shortness of breath.   Cardiovascular: Positive for leg swelling (chronic). Negative for chest pain and palpitations.  Gastrointestinal: Negative for abdominal pain and vomiting.  Genitourinary: Negative for dysuria and hematuria.  Musculoskeletal: Negative for arthralgias and back pain.  Skin: Negative for color change and rash.  Neurological: Negative for seizures and syncope.  All other systems reviewed and are negative.   Physical Exam Updated Vital Signs BP (!) 128/57   Pulse 70   Temp 98.4 F (36.9 C) (Oral)   Resp (!) 29   Ht _0  (1.702 m)   Wt 77.1 kg   SpO2 98%   BMI 26.63 kg/m   Physical Exam Vitals and nursing note  reviewed.  Constitutional:      General: She is not in acute distress.    Appearance: She is well-developed.  HENT:     Head: Normocephalic and atraumatic.  Eyes:     Conjunctiva/sclera: Conjunctivae normal.  Cardiovascular:     Rate and Rhythm: Normal rate and regular rhythm.     Pulses: Normal pulses.     Heart sounds: Normal heart sounds. No murmur heard.   Pulmonary:     Effort: Pulmonary effort is normal. No respiratory distress.     Breath sounds: Decreased breath sounds present. No wheezing, rhonchi or rales.     Comments: Coarse breath sounds throughout Abdominal:     Palpations: Abdomen is soft.     Tenderness: There is no abdominal tenderness.  Musculoskeletal:     Cervical back: Normal range of motion and neck supple.     Right lower leg: Edema (1+) present.     Left lower leg: Edema (1+) present.  Skin:    General: Skin is warm and dry.     Capillary Refill: Capillary refill takes less than 2 seconds.  Neurological:     General: No focal deficit present.     Mental Status: She is alert.  Psychiatric:        Mood and Affect: Mood normal.     ED Results / Procedures / Treatments   Labs (all labs ordered are listed, but only abnormal results are displayed) Labs Reviewed  CBC WITH  DIFFERENTIAL/PLATELET - Abnormal; Notable for the following components:      Result Value   WBC 3.9 (*)    RBC 3.79 (*)    Hemoglobin 11.3 (*)    HCT 35.7 (*)    Platelets 90 (*)    All other components within normal limits  COMPREHENSIVE METABOLIC PANEL - Abnormal; Notable for the following components:   Glucose, Bld 107 (*)    BUN 35 (*)    Creatinine, Ser 1.55 (*)    GFR, Estimated 33 (*)    All other components within normal limits  LIPASE, BLOOD - Abnormal; Notable for the following components:   Lipase 52 (*)    All other components within normal limits  BRAIN NATRIURETIC PEPTIDE - Abnormal; Notable for the following components:   B Natriuretic Peptide 707.8 (*)    All other components within normal limits  PROTIME-INR - Abnormal; Notable for the following components:   Prothrombin Time 34.7 (*)    INR 3.5 (*)    All other components within normal limits  TROPONIN I (HIGH SENSITIVITY) - Abnormal; Notable for the following components:   Troponin I (High Sensitivity) 18 (*)    All other components within normal limits  RESP PANEL BY RT-PCR (FLU A&B, COVID) ARPGX2  TROPONIN I (HIGH SENSITIVITY)    EKG None  Radiology DG Chest 2 View  Result Date: 06/10/2020 CLINICAL DATA:  Cough for 6 weeks EXAM: CHEST - 2 VIEW COMPARISON:  05/27/2020 FINDINGS: Frontal and lateral views of the chest demonstrate postsurgical changes from median sternotomy and mitral and tricuspid valve annuloplasty is. Single lead pacer unchanged. The cardiac silhouette is markedly enlarged but stable. No acute airspace disease, effusion, or pneumothorax. No acute bony abnormalities. IMPRESSION: 1. Continued marked enlargement the cardiac silhouette. 2. No acute airspace disease. Electronically Signed   By: Randa Ngo M.D.   On: 06/10/2020 15:49   CT Chest Wo Contrast  Result Date: 06/10/2020 CLINICAL DATA:  Cough and shortness of breath EXAM: CT CHEST  WITHOUT CONTRAST TECHNIQUE: Multidetector CT imaging  of the chest was performed following the standard protocol without IV contrast. COMPARISON:  None. FINDINGS: Cardiovascular: There is suggestion of cardiomegaly. There is a right-sided AICD in place. There are advanced coronary artery calcifications. No evidence for thoracic aortic aneurysm. No significant pericardial effusion. Mediastinum/Nodes: -- No mediastinal lymphadenopathy. -- No hilar lymphadenopathy. -- No axillary lymphadenopathy. -- No supraclavicular lymphadenopathy. -- Normal thyroid gland where visualized. -  Unremarkable esophagus. Lungs/Pleura: There is a spiculated pulmonary nodule in the right upper lobe measuring approximately 1.3 cm (axial series 3, image 54). There is diffuse bilateral bronchial wall thickening. No large pleural effusion or pneumothorax. There is a possible 8 mm pulmonary nodule in the lingula (axial series 3, image 73). Upper Abdomen: There is no definite acute abnormality in the upper abdomen. The partially visualized spleen appears to be enlarged. The liver surface is nodular suggestive of underlying cirrhosis. There are cystic changes of both kidneys, not well evaluated on this noncontrast examination. Musculoskeletal: No chest wall abnormality. No bony spinal canal stenosis. IMPRESSION: 1. Massive cardiomegaly. 2. Coronary artery disease. 3. Possible spiculated 1.3 cm nodule in the right upper lobe. Questionable 8 mm nodule in the lingula. Non-contrast chest CT at 3-6 months is recommended. If the nodules are stable at time of repeat CT, then future CT at 18-24 months (from today's scan) is considered optional for low-risk patients, but is recommended for high-risk patients. This recommendation follows the consensus statement: Guidelines for Management of Incidental Pulmonary Nodules Detected on CT Images: From the Fleischner Society 2017; Radiology 2017; 284:228-243. 4. Cirrhotic appearing liver with sequela of portal hypertension. Aortic Atherosclerosis (ICD10-I70.0).  Electronically Signed   By: Constance Holster M.D.   On: 06/10/2020 18:36    Procedures Procedures   Medications Ordered in ED Medications  furosemide (LASIX) injection 40 mg (40 mg Intravenous Given 06/10/20 1907)    ED Course  I have reviewed the triage vital signs and the nursing notes.  Pertinent labs & imaging results that were available during my care of the patient were reviewed by me and considered in my medical decision making (see chart for details).    MDM Rules/Calculators/A&P                          Annette Powers is an 85 year old female with history of A. fib on Coumadin, heart failure, liver disease who presents the ED with shortness of breath, cough.  Symptoms for the last several weeks.  Was told to increase her Lasix several days ago by cardiology but did not.  She thinks she needs antibiotics as she thinks that she has pneumonia as she has had a cough with some sputum production.  Patient has chronic leg swelling.  Overall she has coarse breath sounds on exam.  No increased work of breathing.  Normal vitals otherwise.  Chest x-ray shows no signs of infection.  No large signs of volume overload.  She is on Coumadin and doubt PE.  INR slightly above goal at 3.5.  BNP is elevated.  Troponin 18.  EKG shows sinus rhythm.  No ischemic changes.  Overall suspect heart failure and volume overload causing her symptoms.  We will get a CT of her chest to further evaluate for infectious process.  She has no leukocytosis.  No significant anemia or electrolyte abnormality otherwise.  Chronic kidney disease.  COVID and flu test are negative.  Overall troponins unremarkable and repeat 17.  CT scan showed no evidence of pneumonia.  Pulmonary nodule that she was made aware of the need for repeat CT in the future.  Overall suspect symptoms secondary to volume overload.  Encouraged her to start taking her Lasix as prescribed by her cardiologist.  She understands to hold Coumadin dose for  today and will need to follow-up in Coumadin clinic.  Discharged in good condition.  This chart was dictated using voice recognition software.  Despite best efforts to proofread,  errors can occur which can change the documentation meaning.    Final Clinical Impression(s) / ED Diagnoses Final diagnoses:  Acute on chronic right-sided heart failure [facility])    Rx / DC Orders ED Discharge Orders    None       Lennice Sites, DO 06/10/20 2011

## 2020-06-10 NOTE — ED Triage Notes (Signed)
Cough x 6 weeks. She has seen her MD with no improvement.

## 2020-06-10 NOTE — Discharge Instructions (Addendum)
Please obtain repeat CT scan of chest as discussed from recommendations below.  Take your Lasix as prescribed by her cardiologist.  Follow-up closely with cardiology.  Possible spiculated 1.3 cm nodule in the right upper lobe.  Questionable 8 mm nodule in the lingula. Non-contrast chest CT at  3-6 months is recommended. If the nodules are stable at time of  repeat CT, then future CT at 18-24 months (from today's scan) is  considered optional for low-risk patients, but is recommended for  high-risk patients. This recommendation follows the consensus  statement: Guidelines for Management of Incidental Pulmonary Nodules  Detected on CT Images: From the Fleischner Society 2017; Radiology  2017; 284:228-243.

## 2020-06-11 ENCOUNTER — Other Ambulatory Visit (HOSPITAL_BASED_OUTPATIENT_CLINIC_OR_DEPARTMENT_OTHER): Payer: Self-pay

## 2020-06-11 ENCOUNTER — Telehealth: Payer: Self-pay | Admitting: Family

## 2020-06-11 DIAGNOSIS — R911 Solitary pulmonary nodule: Secondary | ICD-10-CM | POA: Insufficient documentation

## 2020-06-11 MED ORDER — WARFARIN SODIUM 10 MG PO TABS
10.0000 mg | ORAL_TABLET | ORAL | 0 refills | Status: DC
  Filled 2020-06-11: qty 30, 70d supply, fill #0

## 2020-06-11 MED ORDER — WARFARIN SODIUM 5 MG PO TABS
5.0000 mg | ORAL_TABLET | ORAL | 3 refills | Status: DC
  Filled 2020-06-11: qty 30, 53d supply, fill #0

## 2020-06-11 NOTE — Telephone Encounter (Signed)
Please contact pt and let her know that I reviewed the CT she had done in the ER. CT notes a 1.3 cm lung nodule. I would like to refer her to pulmonology for further evaluation of this lesion.

## 2020-06-11 NOTE — Telephone Encounter (Signed)
Patient was advised of results and referral. She was also scheduled to come in this week for INR check.

## 2020-06-14 ENCOUNTER — Ambulatory Visit (INDEPENDENT_AMBULATORY_CARE_PROVIDER_SITE_OTHER): Payer: Medicare Other | Admitting: Family

## 2020-06-14 ENCOUNTER — Other Ambulatory Visit: Payer: Self-pay

## 2020-06-14 DIAGNOSIS — Z7901 Long term (current) use of anticoagulants: Secondary | ICD-10-CM

## 2020-06-14 LAB — POCT INR: INR: 2.4 (ref 2.0–3.0)

## 2020-06-14 NOTE — Progress Notes (Addendum)
Pt here for INR check Annette Ra NP  Last INR=1.7  Goal INR:2.0-3.0  Pt currently takes  Coumadin 5mg  daily and on Tuesday, Thursday, Saturday take 10mg .  Pt denies recent antibiotics, no dietary changes and no unusual bruising / bleeding.  INR today = 2.4  Per Melissa she would like pt to continue the regimen above. Pt is to return in 1 month for INR check.  Pt is scheduled for 07/12/20

## 2020-06-28 ENCOUNTER — Encounter: Payer: Self-pay | Admitting: Family

## 2020-06-28 ENCOUNTER — Telehealth: Payer: Self-pay | Admitting: Family

## 2020-06-28 ENCOUNTER — Emergency Department (HOSPITAL_BASED_OUTPATIENT_CLINIC_OR_DEPARTMENT_OTHER)
Admission: EM | Admit: 2020-06-28 | Discharge: 2020-06-28 | Disposition: A | Discharge status: Home / Self Care | Payer: Medicare Other | Attending: Emergency Medicine | Admitting: Emergency Medicine

## 2020-06-28 ENCOUNTER — Other Ambulatory Visit (HOSPITAL_BASED_OUTPATIENT_CLINIC_OR_DEPARTMENT_OTHER): Payer: Self-pay

## 2020-06-28 ENCOUNTER — Other Ambulatory Visit: Payer: Self-pay

## 2020-06-28 ENCOUNTER — Ambulatory Visit (INDEPENDENT_AMBULATORY_CARE_PROVIDER_SITE_OTHER): Payer: Medicare Other | Admitting: Family

## 2020-06-28 ENCOUNTER — Emergency Department (HOSPITAL_BASED_OUTPATIENT_CLINIC_OR_DEPARTMENT_OTHER): Payer: Medicare Other

## 2020-06-28 DIAGNOSIS — S50312A Abrasion of left elbow, initial encounter: Secondary | ICD-10-CM | POA: Diagnosis not present

## 2020-06-28 DIAGNOSIS — Y9301 Activity, walking, marching and hiking: Secondary | ICD-10-CM | POA: Diagnosis not present

## 2020-06-28 DIAGNOSIS — Z79899 Other long term (current) drug therapy: Secondary | ICD-10-CM | POA: Diagnosis not present

## 2020-06-28 DIAGNOSIS — S51812A Laceration without foreign body of left forearm, initial encounter: Secondary | ICD-10-CM | POA: Diagnosis not present

## 2020-06-28 DIAGNOSIS — S92412A Displaced fracture of proximal phalanx of left great toe, initial encounter for closed fracture: Secondary | ICD-10-CM | POA: Insufficient documentation

## 2020-06-28 DIAGNOSIS — S8001XA Contusion of right knee, initial encounter: Secondary | ICD-10-CM | POA: Diagnosis not present

## 2020-06-28 DIAGNOSIS — N183 Chronic kidney disease, stage 3 unspecified: Secondary | ICD-10-CM | POA: Diagnosis not present

## 2020-06-28 DIAGNOSIS — Z7901 Long term (current) use of anticoagulants: Secondary | ICD-10-CM | POA: Insufficient documentation

## 2020-06-28 DIAGNOSIS — Z85118 Personal history of other malignant neoplasm of bronchus and lung: Secondary | ICD-10-CM | POA: Insufficient documentation

## 2020-06-28 DIAGNOSIS — W108XXA Fall (on) (from) other stairs and steps, initial encounter: Secondary | ICD-10-CM | POA: Diagnosis not present

## 2020-06-28 DIAGNOSIS — I509 Heart failure, unspecified: Secondary | ICD-10-CM | POA: Insufficient documentation

## 2020-06-28 DIAGNOSIS — I5042 Chronic combined systolic (congestive) and diastolic (congestive) heart failure: Secondary | ICD-10-CM | POA: Diagnosis not present

## 2020-06-28 DIAGNOSIS — E039 Hypothyroidism, unspecified: Secondary | ICD-10-CM | POA: Insufficient documentation

## 2020-06-28 DIAGNOSIS — I4891 Unspecified atrial fibrillation: Secondary | ICD-10-CM | POA: Insufficient documentation

## 2020-06-28 DIAGNOSIS — R911 Solitary pulmonary nodule: Secondary | ICD-10-CM | POA: Diagnosis not present

## 2020-06-28 DIAGNOSIS — W19XXXA Unspecified fall, initial encounter: Secondary | ICD-10-CM

## 2020-06-28 DIAGNOSIS — S8002XA Contusion of left knee, initial encounter: Secondary | ICD-10-CM | POA: Insufficient documentation

## 2020-06-28 DIAGNOSIS — I130 Hypertensive heart and chronic kidney disease with heart failure and stage 1 through stage 4 chronic kidney disease, or unspecified chronic kidney disease: Secondary | ICD-10-CM | POA: Insufficient documentation

## 2020-06-28 DIAGNOSIS — S99922A Unspecified injury of left foot, initial encounter: Secondary | ICD-10-CM | POA: Diagnosis present

## 2020-06-28 MED ORDER — WARFARIN SODIUM 5 MG PO TABS
ORAL_TABLET | ORAL | 0 refills | Status: DC
  Filled 2020-06-28: qty 30, 30d supply, fill #0
  Filled 2020-06-28: qty 30, fill #0
  Filled 2020-07-04: qty 30, 30d supply, fill #0

## 2020-06-28 MED ORDER — LIDOCAINE HCL URETHRAL/MUCOSAL 2 % EX GEL
1.0000 "application " | Freq: Once | CUTANEOUS | Status: AC
  Administered 2020-06-28: 1 via TOPICAL
  Filled 2020-06-28: qty 11

## 2020-06-28 MED ORDER — HYDROCODONE-ACETAMINOPHEN 5-325 MG PO TABS
1.0000 | ORAL_TABLET | ORAL | 0 refills | Status: DC | PRN
  Filled 2020-06-28: qty 10, 2d supply, fill #0

## 2020-06-28 MED ORDER — ACETAMINOPHEN 325 MG PO TABS
650.0000 mg | ORAL_TABLET | Freq: Once | ORAL | Status: AC
  Administered 2020-06-28: 650 mg via ORAL
  Filled 2020-06-28: qty 2

## 2020-06-28 NOTE — ED Notes (Signed)
Fell walking to car at this facility  Skin tear to left forearm  C/o bilateral great toe pain and knee pain  Denies LOC, N/V Takes warfarin - open heart surgery x 2 and pacemaker

## 2020-06-28 NOTE — Progress Notes (Signed)
Subjective:   By signing my name below, I, [name], attest that this documentation has been prepared under the direction and in the presence of [name]. 06/28/2020      Patient ID: Annette Powers, female    DOB: December 31, 1935, 85 y.o.   MRN: [id number]  No chief complaint on file.   HPI   Patient is in today for a office visit.   ED- She was admitted to the emergency room on 06/10/2020 for acute on chronic right-sided heart failure. While in the ER they did a CAT scan which revealed a spot in her lung. She was referred to the pulmonologist to manage that issue but they have not reached out to there. She also mentions that she has not heard back re: ophthalmology appointment either.   She also complains about swelling in her upper arm after getting a small cut and bleeding too much. She stopped taking her coumadin to manage her bleeding. She resumed taking it shortly after. She is requesting a refill for 5 mg coumadin daily PO.  She has 3 Covid-19 vaccines and is not willing to get the 4th vaccine.  Past Medical History:  Diagnosis Date  . Abdominal bruit   . Acute blood loss anemia 03/09/2017  . Anemia, unspecified 04/23/2008   Formatting of this note might be different from the original. Work up done 04/2008 showed normal Vit T61, folic acid, iron studies and no monoclonal protein. Normal sed rate. Most likely anemia due to renal insufficiency. Bone marrow biopsy performed 01/06/2011 showed variably cellular marrow with normocellular and hypocellular areas with no dysplastic changes. Adequate megakaryocytes. No malignanc  . Atrial fibrillation ([place])   . Atrial fibrillation, chronic ([place]) 03/09/2017  . Bradycardia 04/09/2020   Formatting of this note might be different from the original. Pacemaker implanted   Last Assessment & Plan:  Formatting of this note might be different from the original. S/p pacemaker  . Bronchiectasis without complication ([place]) 4/43/1540  .  Bronchitis   . CHF (congestive heart failure) ([place])   . Chronic renal failure, stage 3 (moderate) ([place]) 02/19/2020  . Congestive heart failure ([place]) 02/19/2020  . Cough 11/01/2012  . CVA (cerebral vascular accident) ([place]) 11/05/2009  . Dyspnea on exertion 05/16/2010   Last Assessment & Plan:  Formatting of this note might be different from the original. Stable at this time.  . Encounter for therapeutic drug level monitoring 12/21/2017  . Essential hypertension 03/09/2017  . Family history of malignant neoplasm of breast 05/26/2008   Formatting of this note might be different from the original. Mother died at age 45, sister diagnosed at age 31 with breast cancer. Maternal aunt with abdominal cancer(per patient not ovarian). Maternal grandmother died age 83 of CVA. Declined genetic councelling  . GI bleed 03/09/2017  . HLD (hyperlipidemia) 03/09/2017  . Hyperlipidemia   . Hypertension   . Hypothyroidism 03/09/2017  . Liver cirrhosis ([place])   . Long term (current) use of anticoagulants 10/30/2013   Last Assessment & Plan:  Formatting of this note is different from the original. No bleeding issues. Continue with warfarin  INR, Capillary (no units)  Date Value  06/16/2019 2.0  . LV dysfunction 07/05/2012   Formatting of this note might be different from the original. EF 40-45% per echo 05/2012  Echo 01/2015 LVEF 44%  06/2016 LVEF 45%   Last Assessment & Plan:  Formatting of this note might be different from the original. LVEF 45% per echo 06/2016. Currently  asymptomatic. Will continue observation with a repeat echo. Remain on BB, aldactone and ARB regimen.  . Macular degeneration   . MAI (mycobacterium avium-intracellulare) ([place])   . Malignant neoplasm of main bronchus ([place]) 06/07/2018  . Mycobacterium avium infection ([place]) 02/19/2020  . Other cirrhosis of liver ([place]) 02/19/2020  . Pacemaker 02/19/2020  . Primary thrombocytopenia ([place]) 04/23/2008   Formatting of this note might be different from the original.  Thrombocytopenia documented since 2003/03/21, mild, chronic. 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 US of the abdomen 01/2008 showed spleen measuring 16 cm.. Adequate megakaryocytes noted on bone marrow biopsy 12/2010  . 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 Struggling with nerve injury resulting in dominant L hand weakness and numbness. Quite depressed about the situation. Encouraged her to continue with therapy.  . Pulmonary hypertension ([place]) 02/19/2020  . Rectal bleeding 03/09/2017  . Splenomegaly 05/26/2008   Formatting of this note might be different from the original. Noted on 01/2008 renal ultrasound measuring 16X6.5 cm  . Status post mitral valve replacement with bioprosthetic valve 04/09/2020   Formatting of this note is different from the original. 11/2013 Barb Merino) Redo sternotomy, redo mitral valve surgery consisting of mitral valve replacement with a size 27 Hancock II porcine bioprosthesis, tricuspid valve repair utilizing a size 36 Carpentier-Edwards Classic tricuspid annuloplasty ring through a redo sternotomy.  06/22/16 Echocardiogram Mildly decreased left ventricular systolic func  . Thyroid disease   . Tremor 02/19/2020  . Vitamin D deficiency 02/19/2020    Past Surgical History:  Procedure Laterality Date  . ABDOMINAL HYSTERECTOMY  1985  . CHOLECYSTECTOMY  1969  . MITRAL VALVE REPLACEMENT  11/15/2013   bioprosthetic valve (redo)  . MITRAL VALVE REPLACEMENT  1998  . PACEMAKER INSERTION  07/07/2018  . TRICUSPID VALVE SURGERY  11/2013   repair  . VASCULAR SURGERY Left    had repair of aneurysm left arm following removal of central line    Family History  Problem Relation Age of Onset  . Breast cancer Mother        died at age 47  . Hyperlipidemia Sister   . Diabetes Mellitus I Daughter 25    Social History   Socioeconomic History  . Marital status: Widowed     Spouse name: Not on file  . Number of children: Not on file  . Years of education: Not on file  . Highest education level: Not on file  Occupational History  . Not on file  Tobacco Use  . Smoking status: Never Smoker  . Smokeless tobacco: Never Used  Vaping Use  . Vaping Use: Never used  Substance and Sexual Activity  . Alcohol use: Yes    Comment: rare alcohol  . Drug use: No  . Sexual activity: Not Currently  Other Topics Concern  . Not on file  Social History Narrative   Husband died in 03-21-19   Retired respiratory therapist   One daughter died at age 55   One living son, lives with im.   She belongs to the [employer]   No pets   Social Determinants of Health   Financial Resource Strain: Not on file  Food Insecurity: Not on file  Transportation Needs: Not on file  Physical Activity: Not on file  Stress: Not on file  Social Connections: Not on file  Intimate Partner Violence: Not on file    Outpatient Medications Prior to  Visit  Medication Sig Dispense Refill  . carvedilol (COREG) 6.25 MG tablet Take 1 tablet (6.25 mg total) by mouth 2 (two) times daily with a meal. 180 tablet 1  . Cholecalciferol 5000 units TABS Take 5,000 Units by mouth daily.    . cholestyramine (QUESTRAN) 4 g packet Take 1 packet (4 g total) by mouth 3 (three) times daily with meals. (Patient taking differently: Take 4 g by mouth as needed (IBS).) 60 each 1  . cholestyramine (QUESTRAN) 4 GM/DOSE powder Take 4 g by mouth as needed.     . ezetimibe (ZETIA) 10 MG tablet Take 1 tablet (10 mg total) by mouth daily. 90 tablet 1  . furosemide (LASIX) 40 MG tablet Take 1 tablet (40 mg total) by mouth daily. 90 tablet 3  . levothyroxine (SYNTHROID) 200 MCG tablet Take 1 tablet (200 mcg total) by mouth daily. 90 tablet 1  . levothyroxine (SYNTHROID) 25 MCG tablet Take 1 tablet (25 mcg total) by mouth daily. 90 tablet 3  . Multiple Vitamins-Minerals (PRESERVISION AREDS PO) Take 1 tablet by  mouth 2 (two) times daily. Unknown strength    . sacubitril-valsartan (ENTRESTO) 24-26 MG Take 1 tablet by mouth 2 (two) times daily. 60 tablet 2  . spironolactone (ALDACTONE) 50 MG tablet Take 1 tablet (50 mg total) by mouth daily. 90 tablet 1  . traZODone (DESYREL) 100 MG tablet Take 1 tablet (100 mg total) by mouth at bedtime. 90 tablet 1  . warfarin (COUMADIN) 10 MG tablet Take 1 tablet (10 mg total) by mouth 3 (three) times a week. 30 tablet 0  . warfarin (COUMADIN) 5 MG tablet 63m daily except 136mon Tuesday, Thursday and saturday (Patient taking differently: Take 5 mg by mouth as directed. 75m71maily except 14m74m Tuesday, Thursday and saturday) 125 tablet 1  . warfarin (COUMADIN) 5 MG tablet Take 1 tablet (5 mg total) by mouth 4 (four) times a week. 30 tablet 3   No facility-administered medications prior to visit.    No Known Allergies  Review of Systems  Skin:       Bruise on forearms.       Objective:    Physical Exam Constitutional:      Appearance: Normal appearance.  HENT:     Head: Normocephalic and atraumatic.     Right Ear: External ear normal.     Left Ear: External ear normal.  Eyes:     Extraocular Movements: Extraocular movements intact.     Pupils: Pupils are equal, round, and reactive to light.  Cardiovascular:     Rate and Rhythm: Normal rate and regular rhythm.     Pulses: Normal pulses.     Heart sounds: Normal heart sounds. No murmur heard. No gallop.   Pulmonary:     Effort: Pulmonary effort is normal. No respiratory distress.     Breath sounds: Normal breath sounds. No wheezing, rhonchi or rales.  Skin:    General: Skin is warm and dry.     Findings: Bruising (Forearms) present.  Neurological:     Mental Status: She is alert and oriented to person, place, and time.  Psychiatric:        Behavior: Behavior normal.     There were no vitals taken for this visit. Wt Readings from Last 3 Encounters:  06/10/20 170 lb (77.1 kg)  06/07/20 169  lb (76.7 kg)  05/31/20 179 lb (81.2 kg)    Diabetic Foot Exam - Simple   No data filed  Lab Results  Component Value Date   WBC 3.9 (L) 06/10/2020   HGB 11.3 (L) 06/10/2020   HCT 35.7 (L) 06/10/2020   PLT 90 (L) 06/10/2020   GLUCOSE 107 (H) 06/10/2020   CHOL 148 02/19/2020   TRIG 103.0 02/19/2020   HDL 44.60 02/19/2020   LDLCALC 83 02/19/2020   ALT 14 06/10/2020   AST 20 06/10/2020   NA 138 06/10/2020   K 4.1 06/10/2020   CL 103 06/10/2020   CREATININE 1.55 (H) 06/10/2020   BUN 35 (H) 06/10/2020   CO2 26 06/10/2020   TSH 31.96 (H) 05/28/2020   INR 2.4 06/14/2020    Lab Results  Component Value Date   TSH 31.96 (H) 05/28/2020   Lab Results  Component Value Date   WBC 3.9 (L) 06/10/2020   HGB 11.3 (L) 06/10/2020   HCT 35.7 (L) 06/10/2020   MCV 94.2 06/10/2020   PLT 90 (L) 06/10/2020   Lab Results  Component Value Date   NA 138 06/10/2020   K 4.1 06/10/2020   CO2 26 06/10/2020   GLUCOSE 107 (H) 06/10/2020   BUN 35 (H) 06/10/2020   CREATININE 1.55 (H) 06/10/2020   BILITOT 0.5 06/10/2020   ALKPHOS 95 06/10/2020   AST 20 06/10/2020   ALT 14 06/10/2020   PROT 7.0 06/10/2020   ALBUMIN 4.2 06/10/2020   CALCIUM 9.3 06/10/2020   ANIONGAP 9 06/10/2020   EGFR 35 (L) 04/17/2020   GFR 32.12 (L) 05/27/2020   Lab Results  Component Value Date   CHOL 148 02/19/2020   Lab Results  Component Value Date   HDL 44.60 02/19/2020   Lab Results  Component Value Date   LDLCALC 83 02/19/2020   Lab Results  Component Value Date   TRIG 103.0 02/19/2020   Lab Results  Component Value Date   CHOLHDL 3 02/19/2020   No results found for: HGBA1C     Assessment & Plan:   Problem List Items Addressed This Visit   None      No orders of the defined types were placed in this encounter.   I, [name], personally preformed the services described in this documentation.  All medical record entries made by the scribe were at my direction and in my  presence.  I have reviewed the chart and discharge instructions (if applicable) and agree that the record reflects my personal performance and is accurate and complete. 06/28/2020   I,[name],acting as a Education administrator for Nance Pear, NP.,have documented all relevant documentation on the behalf of Nance Pear, NP,as directed by  Nance Pear, NP while in the presence of Nance Pear, NP.   Shehryar [employer]

## 2020-06-28 NOTE — ED Provider Notes (Signed)
[facility] EMERG[facility]Y DEPARTMENT Provider Note   CSN: [id number] Arrival date & time: 06/28/20  1116     History Chief Complaint  Patient presents with  . Fall    Annette Powers is a 85 y.o. female.  Pt presents to the ED today as a fall.  The pt was walking to her car from her appointment upstairs with Dr. Inda Castle.  The pt said she tripped and fell.  She is on coumadin due to afib and mitral valve replacement.  She said she hit her head a little, but does not feel like it was much.  Pt fell on both knees, but they don't hurt.  She sustained a skin tear to her left forearm, but her bones don't hurt.  Her right great toe hurts the most.  She was unable to walk initially after the fall.        Past Medical History:  Diagnosis Date  . Abdominal bruit   . Acute blood loss anemia 03/09/2017  . Anemia, unspecified 04/23/2008   Formatting of this note might be different from the original. Work up done 04/2008 showed normal Vit I29, folic acid, iron studies and no monoclonal protein. Normal sed rate. Most likely anemia due to renal insufficiency. Bone marrow biopsy performed 01/06/2011 showed variably cellular marrow with normocellular and hypocellular areas with no dysplastic changes. Adequate megakaryocytes. No malignanc  . Atrial fibrillation ([place])   . Atrial fibrillation, chronic ([place]) 03/09/2017  . Bradycardia 04/09/2020   Formatting of this note might be different from the original. Pacemaker implanted   Last Assessment & Plan:  Formatting of this note might be different from the original. S/p pacemaker  . Bronchiectasis without complication ([place]) 7/98/9211  . Bronchitis   . CHF (congestive heart failure) ([place])   . Chronic renal failure, stage 3 (moderate) ([place]) 02/19/2020  . Congestive heart failure ([place]) 02/19/2020  . Cough 11/01/2012  . CVA (cerebral vascular accident) ([place]) 11/05/2009  . Dyspnea on exertion 05/16/2010   Last Assessment & Plan:  Formatting of this note  might be different from the original. Stable at this time.  . Encounter for therapeutic drug level monitoring 12/21/2017  . Essential hypertension 03/09/2017  . Family history of malignant neoplasm of breast 05/26/2008   Formatting of this note might be different from the original. Mother died at age 18, sister diagnosed at age 51 with breast cancer. Maternal aunt with abdominal cancer(per patient not ovarian). Maternal grandmother died age 64 of CVA. Declined genetic councelling  . GI bleed 03/09/2017  . HLD (hyperlipidemia) 03/09/2017  . Hyperlipidemia   . Hypertension   . Hypothyroidism 03/09/2017  . Liver cirrhosis ([place])   . Long term (current) use of anticoagulants 10/30/2013   Last Assessment & Plan:  Formatting of this note is different from the original. No bleeding issues. Continue with warfarin  INR, Capillary (no units)  Date Value  06/16/2019 2.0  . LV dysfunction 07/05/2012   Formatting of this note might be different from the original. EF 40-45% per echo 05/2012  Echo 01/2015 LVEF 44%  06/2016 LVEF 45%   Last Assessment & Plan:  Formatting of this note might be different from the original. LVEF 45% per echo 06/2016. Currently asymptomatic. Will continue observation with a repeat echo. Remain on BB, aldactone and ARB regimen.  . Macular degeneration   . MAI (mycobacterium avium-intracellulare) ([place])   . Malignant neoplasm of main bronchus ([place]) 06/07/2018  . Mycobacterium avium infection ([place]) 02/19/2020  .  Other cirrhosis of liver ([place]) 02/19/2020  . Pacemaker 02/19/2020  . Primary thrombocytopenia ([place]) 04/23/2008   Formatting of this note might be different from the original. Thrombocytopenia documented since 2005, mild, chronic. 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 US of the abdomen 01/2008 showed spleen measuring 16 cm.. Adequate megakaryocytes noted on bone marrow biopsy 12/2010  . 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 Struggling with nerve injury resulting in dominant L hand weakness and numbness. Quite depressed about the situation. Encouraged her to continue with therapy.  . Pulmonary hypertension ([place]) 02/19/2020  . Rectal bleeding 03/09/2017  . Splenomegaly 05/26/2008   Formatting of this note might be different from the original. Noted on 01/2008 renal ultrasound measuring 16X6.5 cm  . Status post mitral valve replacement with bioprosthetic valve 04/09/2020   Formatting of this note is different from the original. 11/2013 Barb Merino) Redo sternotomy, redo mitral valve surgery consisting of mitral valve replacement with a size 27 Hancock II porcine bioprosthesis, tricuspid valve repair utilizing a size 36 Carpentier-Edwards Classic tricuspid annuloplasty ring through a redo sternotomy.  06/22/16 Echocardiogram Mildly decreased left ventricular systolic func  . Thyroid disease   . Tremor 02/19/2020  . Vitamin D deficiency 02/19/2020    Patient Active Problem List   Diagnosis Date Noted  . Lung nodule 06/11/2020  . Bradycardia 04/09/2020  . Status post mitral valve replacement with bioprosthetic valve 04/09/2020  . Thyroid disease   . MAI (mycobacterium avium-intracellulare) ([place])   . Macular degeneration   . Liver cirrhosis ([place])   . Hypertension   . Hyperlipidemia   . CHF (congestive heart failure) ([place])   . Bronchitis   . Abdominal bruit   . Mycobacterium avium infection ([place]) 02/19/2020  . Bronchiectasis without complication ([place]) 36/46/8032  . Pulmonary hypertension ([place]) 02/19/2020  . Other cirrhosis of liver ([place]) 02/19/2020  . Pacemaker 02/19/2020  . Congestive heart failure ([place]) 02/19/2020  . Chronic renal failure, stage 3 (moderate) ([place]) 02/19/2020  . Vitamin D deficiency 02/19/2020  . Tremor 02/19/2020  . Malignant neoplasm of main bronchus ([place]) 06/07/2018  . GI bleed 03/09/2017  . Rectal bleeding 03/09/2017  . Atrial fibrillation, chronic ([place])  03/09/2017  . Essential hypertension 03/09/2017  . HLD (hyperlipidemia) 03/09/2017  . Hypothyroidism 03/09/2017  . 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 Long term (current) use of anticoagulants 10/30/2013  . Cough 11/01/2012  . LV dysfunction 07/05/2012  . Dyspnea on exertion 05/16/2010  . CVA (cerebral vascular accident) ([place]) 11/05/2009  . Family history of malignant neoplasm of breast 05/26/2008  . Splenomegaly 05/26/2008  . Anemia, unspecified 04/23/2008  . Primary thrombocytopenia ([place]) 04/23/2008    Past Surgical History:  Procedure Laterality Date  . ABDOMINAL HYSTERECTOMY  1985  . CHOLECYSTECTOMY  1969  . MITRAL VALVE REPLACEMENT  11/15/2013   bioprosthetic valve (redo)  . MITRAL VALVE REPLACEMENT  1998  . PACEMAKER INSERTION  07/07/2018  . TRICUSPID VALVE SURGERY  11/2013   repair  . 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     Family History  Problem Relation Age of Onset  . Breast cancer Mother        died at age 43  . Hyperlipidemia Sister   . Diabetes Mellitus I Daughter 39  Social History   Tobacco Use  . Smoking status: Never Smoker  . Smokeless tobacco: Never Used  Vaping Use  . Vaping Use: Never used  Substance Use Topics  . Alcohol use: Yes    Comment: rare alcohol  . Drug use: No    Home Medications Prior to Admission medications   Medication Sig Start Date End Date Taking? Authorizing Provider  HYDROcodone-acetaminophen (NORCO/VICODIN) 5-325 MG tablet Take 1 tablet by mouth every 4 (four) hours as needed. 06/28/20  Yes Isla Pence, MD  carvedilol (COREG) 6.25 MG tablet Take 1 tablet (6.25 mg total) by mouth 2 (two) times daily with a meal. 05/31/20   [name], NP  Cholecalciferol 5000 units TABS Take 5,000 Units by mouth daily.    [provider]  cholestyramine (QUESTRAN) 4 g packet Take 1  packet (4 g total) by mouth 3 (three) times daily with meals. Patient taking differently: Take 4 g by mouth as needed (IBS). 05/31/20   [name], NP  cholestyramine Lucrezia Starch) 4 GM/DOSE powder Take 4 g by mouth as needed.     [provider]  ezetimibe (ZETIA) 10 MG tablet Take 1 tablet (10 mg total) by mouth daily. 05/31/20   [name], NP  furosemide (LASIX) 40 MG tablet Take 1 tablet (40 mg total) by mouth daily. 04/23/20 07/22/20  [place] Liter, MD  levothyroxine (SYNTHROID) 200 MCG tablet Take 1 tablet (200 mcg total) by mouth daily. 05/20/20   [name], NP  levothyroxine (SYNTHROID) 25 MCG tablet Take 1 tablet (25 mcg total) by mouth daily. 06/02/20   [name], NP  Multiple Vitamins-Minerals (PRESERVISION AREDS PO) Take 1 tablet by mouth 2 (two) times daily. Unknown strength    [provider]  sacubitril-valsartan (ENTRESTO) 24-26 MG Take 1 tablet by mouth 2 (two) times daily. 06/07/20   [place] Liter, MD  spironolactone (ALDACTONE) 50 MG tablet Take 1 tablet (50 mg total) by mouth daily. 05/20/20   [name], NP  traZODone (DESYREL) 100 MG tablet Take 1 tablet (100 mg total) by mouth at bedtime. 05/31/20   [name], NP  warfarin (COUMADIN) 10 MG tablet Take 1 tablet (10 mg total) by mouth 3 (three) times a week. 06/12/20   [name], NP  warfarin (COUMADIN) 5 MG tablet 59m daily except 170mon Tuesday, Thursday and saturday Patient taking differently: Take 5 mg by mouth as directed. 3m70maily except 66m31m Tuesday, Thursday and saturday 05/31/20   O'SuDebbrah Alar  warfarin (COUMADIN) 5 MG tablet Take as directed 06/28/20   O'SuDebbrah Alar    Allergies    Patient has no known allergies.  Review of Systems   Review of Systems  Musculoskeletal:       Right large toe pain  All other systems reviewed and are negative.   Physical Exam Updated Vital Signs BP 133/73   Pulse 72    Temp 98.2 F (36.8 C) (Oral)   Resp 18   Ht 5' 7"  (1.702 m)   Wt 75.3 kg   SpO2 99%   BMI 26.00 kg/m   Physical Exam Vitals and nursing note reviewed.  Constitutional:      Appearance: Normal appearance.  HENT:     Head: Normocephalic and atraumatic.     Right Ear: External ear normal.     Left Ear: External ear normal.     Nose: Nose normal.     Mouth/Throat:     Mouth: Mucous membranes are moist.  Pharynx: Oropharynx is clear.  Eyes:     Extraocular Movements: Extraocular movements intact.     Conjunctiva/sclera: Conjunctivae normal.     Pupils: Pupils are equal, round, and reactive to light.  Cardiovascular:     Rate and Rhythm: Normal rate and regular rhythm.     Pulses: Normal pulses.     Heart sounds: Normal heart sounds.  Pulmonary:     Effort: Pulmonary effort is normal.     Breath sounds: Normal breath sounds.  Abdominal:     General: Abdomen is flat. Bowel sounds are normal.     Palpations: Abdomen is soft.  Musculoskeletal:     Cervical back: Normal range of motion and neck supple.     Comments: Bruises to both knees.  Good ROM.  Skin:    Capillary Refill: Capillary refill takes less than 2 seconds.     Comments: Several abrasions to left elbow  Neurological:     General: No focal deficit present.     Mental Status: She is alert and oriented to person, place, and time.  Psychiatric:        Mood and Affect: Mood normal.        Behavior: Behavior normal.     ED Results / Procedures / Treatments   Labs (all labs ordered are listed, but only abnormal results are displayed) Labs Reviewed - No data to display  EKG None  Radiology DG Toe Great Right  Result Date: 06/28/2020 CLINICAL DATA:  Right toe pain.  Mechanical fall EXAM: RIGHT GREAT TOE COMPARISON:  None. FINDINGS: Acute, mildly displaced and angulated fracture through the diaphysis and distal metaphysis of the great toe proximal phalanx. Dorsal and lateral angulation. Fracture closely  approximates the articular surface of the interphalangeal joint without definite intra-articular extension. No dislocation. No additional fractures. Bones are demineralized. Vascular calcifications are present. IMPRESSION: Acute, mildly displaced and angulated fracture of the great toe proximal phalanx as described. Electronically Signed   By: Davina Poke D.O.   On: 06/28/2020 12:29    Procedures Procedures   Medications Ordered in ED Medications  acetaminophen (TYLENOL) tablet 650 mg (has no administration in time range)  lidocaine (XYLOCAINE) 2 % jelly 1 application (has no administration in time range)    ED Course  I have reviewed the triage vital signs and the nursing notes.  Pertinent labs & imaging results that were available during my care of the patient were reviewed by me and considered in my medical decision making (see chart for details).    MDM Rules/Calculators/A&P                          Pt did not want to get a CT of her head.  She feels like she hardly hit her head.  Pt does have a fx to her right great toe.  She is put in a cam walker.  She is to f/u with ortho.  She did not want anything more than tylenol in ED, but will take some pain meds to take if needed at home.  Skin tear/abrasion cleaned and dressed.    Pt is stable for d/c.  Return if worse. Final Clinical Impression(s) / ED Diagnoses Final diagnoses:  Fall, initial encounter  Abrasion of left elbow, initial encounter  Closed displaced fracture of proximal phalanx of left great toe, initial encounter    Rx / DC Orders ED Discharge Orders  Ordered    HYDROcodone-acetaminophen (NORCO/VICODIN) 5-325 MG tablet  Every 4 hours PRN        06/28/20 1250           Isla Pence, MD 06/28/20 1252

## 2020-06-28 NOTE — Patient Instructions (Addendum)
Please call to schedule an appointment with Dr. June Leap (lung doctor) for your lung spot- [phone]. Dr. Zigmund Daniel (Ophthalmology)- [phone].

## 2020-06-28 NOTE — ED Triage Notes (Signed)
Fell walking to car at this facility  Skin tear to left forearm  C/o bilateral great toe pain and knee pain

## 2020-06-28 NOTE — ED Notes (Signed)
Called to parking lot by family, pt states she had just left her primary MD office and was attempting to get in the car. States she experienced a mechanical fall, states she does not recall hitting her head, c/o left arm pain, left arm noted to have multiple skin tears with active bleeding. Pt was assisted to standing position by EMT and assisted into wc. Pt agreed to be evaluated in our ED.

## 2020-06-28 NOTE — Telephone Encounter (Signed)
Please call Dr. West Pugh office and let them know that pt has not been contacted to schedule her appointment.

## 2020-06-28 NOTE — ED Notes (Addendum)
Ortho boot was applied to pt right foot by tech Marya Amsler. Upon discharging pt - pt states that she cannot walk - pt states that her son suggested Rehab facility - pt son states that he did not have time to take care of pt r/t spouse having "another stroke". Pt son left when pt originally checked into the ER and stated that he would be at [facility] taking care of his  spouse.  Tech Marya Amsler suggested a walker - Pt tried to walk with a walker - successful -  and MD ordered the walker.  Pt was asked several times if she feels that she could go home with a walker - replied that she is fine- just needed something like the walker to help/support when standing/walking. Pt does not use a walker or cane on a regular basis - teaching was completed. Wound care teaching was completed and wound supplies - gauze, tape etc was sent home with pt - enough for four days. Charge nurse Sam called for a taxi to take pt home. Pt was in a WC and taken to [facility] to pick up prescription/ Security has voucher for taxi. Pt lives at home with son

## 2020-06-29 NOTE — Assessment & Plan Note (Signed)
Advised pt not to adjust her coumadin dosing on her own, but to call our office if she has concerns that her INR is too high so that we can check it for her and make further recommendations.

## 2020-06-29 NOTE — Assessment & Plan Note (Signed)
Patient appears euvolemic at this time.  Continue lasix and Entresto per cardiology.

## 2020-06-30 NOTE — Assessment & Plan Note (Signed)
Pt has not heard back about her referral to Pulmonology.  I gave her the number to call to schedule her consult.

## 2020-07-04 ENCOUNTER — Other Ambulatory Visit (HOSPITAL_BASED_OUTPATIENT_CLINIC_OR_DEPARTMENT_OTHER): Payer: Self-pay

## 2020-07-04 ENCOUNTER — Ambulatory Visit: Payer: Medicare Other | Admitting: Cardiology

## 2020-07-05 ENCOUNTER — Ambulatory Visit: Payer: Medicare Other

## 2020-07-09 ENCOUNTER — Other Ambulatory Visit (HOSPITAL_BASED_OUTPATIENT_CLINIC_OR_DEPARTMENT_OTHER): Payer: Self-pay

## 2020-07-12 ENCOUNTER — Ambulatory Visit: Payer: Medicare Other

## 2020-07-15 ENCOUNTER — Encounter

## 2020-08-21 ENCOUNTER — Other Ambulatory Visit (HOSPITAL_BASED_OUTPATIENT_CLINIC_OR_DEPARTMENT_OTHER): Payer: Self-pay

## 2020-08-21 ENCOUNTER — Other Ambulatory Visit: Payer: Self-pay

## 2020-08-21 ENCOUNTER — Other Ambulatory Visit: Payer: Self-pay | Admitting: Family

## 2020-08-21 ENCOUNTER — Ambulatory Visit (INDEPENDENT_AMBULATORY_CARE_PROVIDER_SITE_OTHER): Payer: Medicare Other | Admitting: Family

## 2020-08-21 VITALS — BP 125/55 | HR 85 | Temp 98.3°F | Resp 18 | Wt 166.6 lb

## 2020-08-21 DIAGNOSIS — E039 Hypothyroidism, unspecified: Secondary | ICD-10-CM | POA: Diagnosis not present

## 2020-08-21 DIAGNOSIS — I5042 Chronic combined systolic (congestive) and diastolic (congestive) heart failure: Secondary | ICD-10-CM

## 2020-08-21 DIAGNOSIS — E079 Disorder of thyroid, unspecified: Secondary | ICD-10-CM

## 2020-08-21 DIAGNOSIS — Z7901 Long term (current) use of anticoagulants: Secondary | ICD-10-CM

## 2020-08-21 DIAGNOSIS — R911 Solitary pulmonary nodule: Secondary | ICD-10-CM

## 2020-08-21 DIAGNOSIS — R251 Tremor, unspecified: Secondary | ICD-10-CM

## 2020-08-21 DIAGNOSIS — R918 Other nonspecific abnormal finding of lung field: Secondary | ICD-10-CM

## 2020-08-21 DIAGNOSIS — N183 Chronic kidney disease, stage 3 unspecified: Secondary | ICD-10-CM

## 2020-08-21 DIAGNOSIS — H3530 Unspecified macular degeneration: Secondary | ICD-10-CM | POA: Diagnosis not present

## 2020-08-21 LAB — POCT INR: INR: 4.8 — AB (ref 2.0–3.0)

## 2020-08-21 LAB — TSH: TSH: 19.22 u[IU]/mL — ABNORMAL HIGH (ref 0.35–5.50)

## 2020-08-21 MED ORDER — WARFARIN SODIUM 5 MG PO TABS
ORAL_TABLET | ORAL | 0 refills | Status: DC
  Filled 2020-08-21: qty 14, fill #0

## 2020-08-21 MED ORDER — LEVOTHYROXINE SODIUM 25 MCG PO TABS: 25.0000 ug | ORAL_TABLET | Freq: Every day | ORAL | 1 refills | Status: AC

## 2020-08-21 MED ORDER — EZETIMIBE 10 MG PO TABS: 10.0000 mg | ORAL_TABLET | Freq: Every day | ORAL | 1 refills | Status: DC

## 2020-08-21 MED ORDER — WARFARIN SODIUM 5 MG PO TABS: ORAL_TABLET | ORAL | 1 refills | Status: DC

## 2020-08-21 MED ORDER — LEVOTHYROXINE SODIUM 200 MCG PO TABS: 200.0000 ug | ORAL_TABLET | Freq: Every day | ORAL | 1 refills | Status: AC

## 2020-08-21 MED ORDER — FUROSEMIDE 40 MG PO TABS: 40.0000 mg | ORAL_TABLET | Freq: Every day | ORAL | 1 refills | Status: AC

## 2020-08-21 MED ORDER — WARFARIN SODIUM 5 MG PO TABS
ORAL_TABLET | ORAL | 0 refills | Status: DC
  Filled 2020-08-21: qty 14, 7d supply, fill #0

## 2020-08-21 MED ORDER — CARVEDILOL 6.25 MG PO TABS: 6.2500 mg | ORAL_TABLET | Freq: Two times a day (BID) | ORAL | 1 refills | Status: DC

## 2020-08-21 MED ORDER — TRAZODONE HCL 100 MG PO TABS: 100.0000 mg | ORAL_TABLET | Freq: Every day | ORAL | 1 refills | Status: AC

## 2020-08-21 MED ORDER — SPIRONOLACTONE 50 MG PO TABS: 50.0000 mg | ORAL_TABLET | Freq: Every day | ORAL | 1 refills | Status: DC

## 2020-08-21 NOTE — Assessment & Plan Note (Signed)
She is agreeable to see a pulmonologist in [place].  Referral placed.

## 2020-08-21 NOTE — Assessment & Plan Note (Signed)
INR supratherapeutic. Hold coumadin, RTC 7/15 for repeat INR.

## 2020-08-21 NOTE — Assessment & Plan Note (Signed)
Cr stable last blood draw. Monitor.

## 2020-08-21 NOTE — Assessment & Plan Note (Signed)
Stable.  Monitor.  

## 2020-08-21 NOTE — Assessment & Plan Note (Signed)
On Synthroid 225 mcg once daily. Obtain follow up TSH.

## 2020-08-21 NOTE — Assessment & Plan Note (Signed)
Appears euvolemic today. Continue aldactone 50mg  and furosemide 40mg .

## 2020-08-21 NOTE — Patient Instructions (Addendum)
Hold coumadin until we recheck INR on Friday.

## 2020-08-21 NOTE — Assessment & Plan Note (Signed)
Will refer to ophthalmologist in [place].

## 2020-08-21 NOTE — Progress Notes (Signed)
Subjective:   By signing my name below, I, Annette Powers, attest that this documentation has been prepared under the direction and in the presence of Annette Powers. 08/21/2020    Patient ID: Annette Powers, female    DOB: [birth year], 85 y.o.   MRN: [id number]  Chief Complaint  Patient presents with   Follow-up    Concerns/ questions: pt states she is cold all the time, would like all Rx's sent to [place]. She does have some questions about her medications.  Pt asks for an INR today.     HPI Patient is in today for office visit.  Cardiologist- She is planning to set up an appointment with her cardiologist after this visit. Lung spot- She prefers not to see a pulmonologist in [place]. She is willing go to a pulmonologist in [place].  Hand tremors- She reports that she cannot eat with her left hand anymore due to tremors. She met with a neurologist to manage these issues and refused to get injections to help manage her symptoms.  Vision- she did not set up an appointment for vision care after her last visit. She mentions struggling with writing.  Diet- She has decreased appetite and she thinks it because of her medications. Hypothyroidism- She continues taking 25 mch synthroid daily PO and reports doing well while taking it. Dexa- She does not remember the results from the last dexa scan.  Immunizations- She has 2 Covid-19 vaccines at this time and is not willing to get the booster vaccines. She is due for the shingles vaccine is not willing to get it at this time. Hypertension-  She reports doing well while taking 40 mg furosemide daily PO. BP Readings from Last 3 Encounters:  08/21/20 (!) 125/55  06/28/20 121/78  06/28/20 (!) 110/47   INR- She is taking 5 mg warfarin daily PO. She did not receive her last order of 5 mg warfarin and is requesting a 90 day supply of warfarin from her pharmacy.   Lab Results  Component Value Date   INR 4.8 (A) 08/21/2020   INR 2.4  06/14/2020   INR 3.5 (H) 06/10/2020    There are no preventive care reminders to display for this patient.   Past Medical History:  Diagnosis Date   Abdominal bruit    Acute blood loss anemia 03/09/2017   Anemia, unspecified 04/23/2008   Formatting of this note might be different from the original. Work up done 04/2008 showed normal Vit R51, folic acid, iron studies and no monoclonal protein. Normal sed rate. Most likely anemia due to renal insufficiency. Bone marrow biopsy performed 01/06/2011 showed variably cellular marrow with normocellular and hypocellular areas with no dysplastic changes. Adequate megakaryocytes. No malignanc   Atrial fibrillation (HCC)    Atrial fibrillation, chronic ([place]) 03/09/2017   Bradycardia 04/09/2020   Formatting of this note might be different from the original. Pacemaker implanted   Last Assessment & Plan:  Formatting of this note might be different from the original. S/p pacemaker   Bronchiectasis without complication ([place]) 8/84/1660   Bronchitis    CHF (congestive heart failure) (HCC)    Chronic renal failure, stage 3 (moderate) ([place]) 02/19/2020   Congestive heart failure ([place]) 02/19/2020   Cough 11/01/2012   CVA (cerebral vascular accident) ([place]) 11/05/2009   Dyspnea on exertion 05/16/2010   Last Assessment & Plan:  Formatting of this note might be different from the original. Stable at this time.   Encounter for therapeutic drug level  monitoring 12/21/2017   Essential hypertension 03/09/2017   Family history of malignant neoplasm of breast 05/26/2008   Formatting of this note might be different from the original. Mother died at age 64, sister diagnosed at age 61 with breast cancer. Maternal aunt with abdominal cancer(per patient not ovarian). Maternal grandmother died age 11 of CVA. Declined genetic councelling   GI bleed 03/09/2017   HLD (hyperlipidemia) 03/09/2017   Hyperlipidemia    Hypertension    Hypothyroidism 03/09/2017   Liver cirrhosis ([place])    Long  term (current) use of anticoagulants 10/30/2013   Last Assessment & Plan:  Formatting of this note is different from the original. No bleeding issues. Continue with warfarin  INR, Capillary (no units)  Date Value  06/16/2019 2.0   LV dysfunction 07/05/2012   Formatting of this note might be different from the original. EF 40-45% per echo 05/2012  Echo 01/2015 LVEF 44%  06/2016 LVEF 45%   Last Assessment & Plan:  Formatting of this note might be different from the original. LVEF 45% per echo 06/2016. Currently asymptomatic. Will continue observation with a repeat echo. Remain on BB, aldactone and ARB regimen.   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 Thrombocytopenia documented since 2005, mild, chronic. 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 US of the abdomen 01/2008 showed spleen measuring 16 cm.. 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 Struggling with nerve injury resulting in dominant L hand weakness and numbness. Quite depressed about the situation. Encouraged her to continue with therapy.   Pulmonary hypertension ([place]) 02/19/2020   Rectal bleeding 03/09/2017   Splenomegaly 05/26/2008   Formatting of this note might be different from the original. Noted on 01/2008 renal ultrasound measuring 16X6.5 cm   Status post mitral valve replacement with bioprosthetic valve 04/09/2020   Formatting of this note is different from the original. 11/2013 Barb Merino) Redo sternotomy, redo mitral valve surgery consisting of mitral valve replacement with a  size 27 Hancock II porcine bioprosthesis, tricuspid valve repair utilizing a size 36 Carpentier-Edwards Classic tricuspid annuloplasty ring through a redo sternotomy.  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    Family History  Problem Relation Age of Onset   Breast cancer Mother        died at age 8   Hyperlipidemia Sister    Diabetes Mellitus I Daughter 53    Social History   Socioeconomic History   Marital status: Widowed    Spouse name: Not on file   Number of children: Not on file  Years of education: Not on file   Highest education level: Not on file  Occupational History   Not on file  Tobacco Use   Smoking status: Never   Smokeless tobacco: Never  Vaping Use   Vaping Use: Never used  Substance and Sexual Activity   Alcohol use: Yes    Comment: rare alcohol   Drug use: No   Sexual activity: Not Currently  Other Topics Concern   Not on file  Social History Narrative   Husband died in 04/13/2019   Retired respiratory therapist   One daughter died at age 41   One living son, lives with im.   She belongs to the [employer]   No pets   Social Determinants of Health   Financial Resource Strain: Not on file  Food Insecurity: Not on file  Transportation Needs: Not on file  Physical Activity: Not on file  Stress: Not on file  Social Connections: Not on file  Intimate Partner Violence: Not on file    Outpatient Medications Prior to Visit  Medication Sig Dispense  Refill   Cholecalciferol 5000 units TABS Take 5,000 Units by mouth daily.     cholestyramine (QUESTRAN) 4 g packet Take 1 packet (4 g total) by mouth 3 (three) times daily with meals. (Patient taking differently: Take 4 g by mouth as needed (IBS).) 60 each 1   HYDROcodone-acetaminophen (NORCO/VICODIN) 5-325 MG tablet Take 1 tablet by mouth every 4 (four) hours as needed. 10 tablet 0   Multiple Vitamins-Minerals (PRESERVISION AREDS PO) Take 1 tablet by mouth 2 (two) times daily. Unknown strength     sacubitril-valsartan (ENTRESTO) 24-26 MG Take 1 tablet by mouth 2 (two) times daily. 60 tablet 2   carvedilol (COREG) 6.25 MG tablet Take 1 tablet (6.25 mg total) by mouth 2 (two) times daily with a meal. 180 tablet 1   cholestyramine (QUESTRAN) 4 GM/DOSE powder Take 4 g by mouth as needed.      ezetimibe (ZETIA) 10 MG tablet Take 1 tablet (10 mg total) by mouth daily. 90 tablet 1   levothyroxine (SYNTHROID) 200 MCG tablet Take 1 tablet (200 mcg total) by mouth daily. 90 tablet 1   levothyroxine (SYNTHROID) 25 MCG tablet Take 1 tablet (25 mcg total) by mouth daily. 90 tablet 3   spironolactone (ALDACTONE) 50 MG tablet Take 1 tablet (50 mg total) by mouth daily. 90 tablet 1   traZODone (DESYREL) 100 MG tablet Take 1 tablet (100 mg total) by mouth at bedtime. 90 tablet 1   warfarin (COUMADIN) 10 MG tablet Take 1 tablet (10 mg total) by mouth 3 (three) times a week. 30 tablet 0   warfarin (COUMADIN) 5 MG tablet 386m daily except 129mon Tuesday, Thursday and saturday (Patient taking differently: Take 5 mg by mouth as directed. 86m68maily except 78m66m Tuesday, Thursday and saturday) 125 tablet 1   warfarin (COUMADIN) 5 MG tablet Take as directed 30 tablet 0   furosemide (LASIX) 40 MG tablet Take 1 tablet (40 mg total) by mouth daily. 90 tablet 3   No facility-administered medications prior to visit.    No Known Allergies  Review of Systems  Constitutional:        (+)Decreased appetite  Neurological:   Positive for tremors (Left hand).      Objective:    Physical Exam Constitutional:      General: She is not in acute distress.    Appearance: Normal appearance. She is not ill-appearing.  HENT:     Head: Normocephalic and atraumatic.     Right Ear: External ear normal.     Left Ear: External ear normal.  Eyes:     Extraocular Movements: Extraocular movements intact.     Pupils: Pupils are equal, round, and reactive to light.  Cardiovascular:     Rate and Rhythm: Normal rate and regular rhythm.     Pulses: Normal pulses.     Heart sounds: No murmur heard.   No gallop.  Pulmonary:     Effort: Pulmonary effort is normal. No respiratory distress.     Breath sounds: Normal breath sounds. No wheezing, rhonchi or rales.  Musculoskeletal:     Right lower leg: 1+ Edema present.     Left lower leg: 1+ Edema present.  Skin:    General: Skin is warm and dry.  Neurological:     Mental Status: She is alert and oriented to person, place, and time.     Comments: Mild resting left hand tremor   Psychiatric:        Behavior: Behavior normal.        Judgment: Judgment normal.    BP (!) 125/55 (BP Location: Right Arm, Patient Position: Sitting, Cuff Size: Small)   Pulse 85   Temp 98.3 F (36.8 C) (Oral)   Resp 18   Wt 166 lb 9.6 oz (75.6 kg)   SpO2 99%   BMI 26.09 kg/m  Wt Readings from Last 3 Encounters:  08/21/20 166 lb 9.6 oz (75.6 kg)  06/28/20 166 lb (75.3 kg)  06/28/20 166 lb (75.3 kg)   47 minutes spent on today's visit. Time was spent reviewing records, counseling pt.     Assessment & Plan:   Problem List Items Addressed This Visit       Unprioritized   Tremor    Stable. Monitor.        Thyroid disease    On Synthroid 225 mcg once daily. Obtain follow up TSH.        Relevant Medications   levothyroxine (SYNTHROID) 200 MCG tablet   levothyroxine (SYNTHROID) 25 MCG tablet   carvedilol (COREG) 6.25 MG tablet   Macular degeneration    Will refer to  ophthalmologist in [place].         Relevant Orders   Ambulatory referral to Ophthalmology   Lung nodule    She is agreeable to see a pulmonologist in [place].  Referral placed.        Long term (current) use of anticoagulants - Primary    INR supratherapeutic. Hold coumadin, RTC 7/15 for repeat INR.       Relevant Orders   POCT INR (Completed)   Hypothyroidism   Relevant Medications   levothyroxine (SYNTHROID) 200 MCG tablet   levothyroxine (SYNTHROID) 25 MCG tablet   carvedilol (COREG) 6.25 MG tablet   Other Relevant Orders   TSH   Chronic renal failure, stage 3 (moderate) (HCC)    Cr stable last blood draw. Monitor.        CHF (congestive heart failure) ([place])    Appears euvolemic today. Continue aldactone 47m and furosemide 455m        Relevant Medications   furosemide (LASIX) 40 MG tablet   carvedilol (COREG) 6.25 MG tablet   spironolactone (ALDACTONE) 50 MG tablet   ezetimibe (ZETIA) 10 MG tablet   warfarin (COUMADIN) 5 MG tablet   Other Visit Diagnoses     Lung mass       Relevant  Orders   Ambulatory referral to Pulmonology        Meds ordered this encounter  Medications   furosemide (LASIX) 40 MG tablet    Sig: Take 1 tablet (40 mg total) by mouth daily.    Dispense:  90 tablet    Refill:  1   levothyroxine (SYNTHROID) 200 MCG tablet    Sig: Take 1 tablet (200 mcg total) by mouth daily.    Dispense:  90 tablet    Refill:  1   levothyroxine (SYNTHROID) 25 MCG tablet    Sig: Take 1 tablet (25 mcg total) by mouth daily.    Dispense:  90 tablet    Refill:  1   carvedilol (COREG) 6.25 MG tablet    Sig: Take 1 tablet (6.25 mg total) by mouth 2 (two) times daily with a meal.    Dispense:  180 tablet    Refill:  1   spironolactone (ALDACTONE) 50 MG tablet    Sig: Take 1 tablet (50 mg total) by mouth daily.    Dispense:  90 tablet    Refill:  1   traZODone (DESYREL) 100 MG tablet    Sig: Take 1 tablet (100 mg total) by mouth at bedtime.    Dispense:   90 tablet    Refill:  1   ezetimibe (ZETIA) 10 MG tablet    Sig: Take 1 tablet (10 mg total) by mouth daily.    Dispense:  90 tablet    Refill:  1   DISCONTD: warfarin (COUMADIN) 5 MG tablet    Sig: Take as directed    Dispense:  14 tablet    Refill:  0    Order Specific Question:   Supervising Provider    Answer:   Penni Homans A [[id number]]   warfarin (COUMADIN) 5 MG tablet    Sig: Take as directed    Dispense:  90 tablet    Refill:  1    Order Specific Question:   Supervising Provider    Answer:   Penni Homans A [[id number]]    I, Annette Powers, personally preformed the services described in this documentation.  All medical record entries made by the scribe were at my direction and in my presence.  I have reviewed the chart and discharge instructions (if applicable) and agree that the record reflects my personal performance and is accurate and complete. 08/21/2020   I,Annette Powers,acting as a Education administrator for Nance Pear, Powers.,have documented all relevant documentation on the behalf of Nance Pear, Powers,as directed by  Nance Pear, Powers while in the presence of Nance Pear, Powers.   Nance Pear, Powers

## 2020-08-22 ENCOUNTER — Telehealth: Payer: Self-pay | Admitting: Family

## 2020-08-22 DIAGNOSIS — E039 Hypothyroidism, unspecified: Secondary | ICD-10-CM

## 2020-08-22 NOTE — Telephone Encounter (Signed)
TSH is still showing medicine is not strong enough for her.   Is she missing doses?  If not, I would like her to be sure to taken in the am on empty stomach, then wait 30 minutes before she eats or takes other medicine to help medicine be absorbed.  Repeat tsh in 4 weeks.

## 2020-08-22 NOTE — Telephone Encounter (Signed)
Patient states she has been missing doses because she has been sick , and doesn't want an increase in dosage , states she will do better .   Lab appointment made for 09/20/2020

## 2020-08-23 ENCOUNTER — Ambulatory Visit: Payer: Medicare Other

## 2020-08-23 ENCOUNTER — Telehealth: Payer: Self-pay

## 2020-08-23 NOTE — Telephone Encounter (Signed)
Patient called to reschedule INR check from today to Tuesday morning 7/19.  She wanted PCP to be aware of this change.  She had something that came up and had to take care of this afternoon and there were no other available nurse visit appts to move patient to earlier in the day.

## 2020-08-27 ENCOUNTER — Ambulatory Visit (INDEPENDENT_AMBULATORY_CARE_PROVIDER_SITE_OTHER): Payer: Medicare Other | Admitting: *Deleted

## 2020-08-27 ENCOUNTER — Other Ambulatory Visit: Payer: Self-pay

## 2020-08-27 DIAGNOSIS — Z7901 Long term (current) use of anticoagulants: Secondary | ICD-10-CM | POA: Diagnosis not present

## 2020-08-27 LAB — POCT INR: INR: 1.3 — AB (ref 2.0–3.0)

## 2020-08-27 NOTE — Progress Notes (Signed)
Pt here for INR check per [name]  Goal INR = 2.0-3.0  Last INR = 4.8  Pt has not taken warfarin since 08/21/20  Pt denies recent antibiotics, no dietary changes and no unusual bruising / bleeding.  INR today = 1.3  Pt advised per Melissa to restart 5mg  daily and repeat in one week.  Patient scheduled for 09/03/20 at 1015am.

## 2020-09-02 NOTE — Progress Notes (Signed)
Pt here for INR check per [name]   Goal INR = 2.0-3.0   Last INR = 1.3   Pt has not taken warfarin since 08/21/20   Pt denies recent antibiotics, no dietary changes and no unusual bruising / bleeding.   INR today = 2.4   Pt advised per Melissa to restart 5mg  daily and repeat in one week at last check.   Advised today per Dr Etter Sjogren DOD: Continue Coumadin at 5 mg F/U in 2 weeks

## 2020-09-03 ENCOUNTER — Other Ambulatory Visit: Payer: Self-pay

## 2020-09-03 ENCOUNTER — Ambulatory Visit (INDEPENDENT_AMBULATORY_CARE_PROVIDER_SITE_OTHER): Payer: Medicare Other

## 2020-09-03 DIAGNOSIS — Z7901 Long term (current) use of anticoagulants: Secondary | ICD-10-CM | POA: Diagnosis not present

## 2020-09-03 LAB — POCT INR: INR: 2.4 (ref 2.0–3.0)

## 2020-09-17 ENCOUNTER — Ambulatory Visit (INDEPENDENT_AMBULATORY_CARE_PROVIDER_SITE_OTHER): Payer: Medicare Other

## 2020-09-17 ENCOUNTER — Other Ambulatory Visit: Payer: Self-pay

## 2020-09-17 DIAGNOSIS — Z7901 Long term (current) use of anticoagulants: Secondary | ICD-10-CM

## 2020-09-17 LAB — POCT INR: INR: 3.9 — AB (ref 2.0–3.0)

## 2020-09-17 NOTE — Progress Notes (Addendum)
Pt here for INR check per [name]  Goal INR = 2.0-3.0  Last INR = 2.4  Pt currently takes Coumadin 5 mg daily  Pt denies recent antibiotics, no dietary changes and no unusual bruising / bleeding.  INR today = 3.9  I have scheduled her one week follow up INR check. Please advise on further instruction for INR.   Will change to 5mg  once daily except 2.5mg  on Wednesdays/saturdays, repeat in 1 week.   [name]

## 2020-09-18 ENCOUNTER — Telehealth: Payer: Self-pay | Admitting: Family

## 2020-09-18 NOTE — Telephone Encounter (Signed)
Reviewed INR result. Please advise pt as follows:    Change to 5mg  once daily except 2.5mg  on Wednesdays/saturdays, repeat in 1 week.

## 2020-09-18 NOTE — Telephone Encounter (Signed)
Patient advised of dose change, she is scheduled to come in next week for INR

## 2020-09-18 NOTE — Telephone Encounter (Signed)
Lvm for patient to call back about INR results

## 2020-09-20 ENCOUNTER — Other Ambulatory Visit (INDEPENDENT_AMBULATORY_CARE_PROVIDER_SITE_OTHER): Payer: Medicare Other

## 2020-09-20 ENCOUNTER — Ambulatory Visit (INDEPENDENT_AMBULATORY_CARE_PROVIDER_SITE_OTHER): Payer: Medicare Other | Admitting: Cardiology

## 2020-09-20 ENCOUNTER — Encounter: Payer: Self-pay | Admitting: Family

## 2020-09-20 ENCOUNTER — Encounter: Payer: Self-pay | Admitting: Cardiology

## 2020-09-20 ENCOUNTER — Other Ambulatory Visit: Payer: Self-pay

## 2020-09-20 VITALS — BP 104/62 | HR 62 | Ht 67.0 in | Wt 168.0 lb

## 2020-09-20 DIAGNOSIS — I519 Heart disease, unspecified: Secondary | ICD-10-CM

## 2020-09-20 DIAGNOSIS — I5042 Chronic combined systolic (congestive) and diastolic (congestive) heart failure: Secondary | ICD-10-CM

## 2020-09-20 DIAGNOSIS — Z79899 Other long term (current) drug therapy: Secondary | ICD-10-CM | POA: Diagnosis not present

## 2020-09-20 DIAGNOSIS — E039 Hypothyroidism, unspecified: Secondary | ICD-10-CM

## 2020-09-20 DIAGNOSIS — R001 Bradycardia, unspecified: Secondary | ICD-10-CM

## 2020-09-20 LAB — TSH: TSH: 1.65 u[IU]/mL (ref 0.35–5.50)

## 2020-09-20 MED ORDER — ENTRESTO 24-26 MG PO TABS: 1.0000 | ORAL_TABLET | Freq: Two times a day (BID) | ORAL | 3 refills | Status: DC

## 2020-09-20 NOTE — Progress Notes (Signed)
Cardiology Office Note:    Date:  09/20/2020   ID:  Annette Powers, DOB [birth year], MRN [id number]  PCP:  [name], NP  Cardiologist:  Jenne Campus, MD    Referring MD: [name], NP   Chief Complaint  Patient presents with   Medication Management    History of Present Illness:    Annette Powers is a 85 y.o. female    who is being seen today for the evaluation of multiple cardiac issues at the request of [name], NP.  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  She did have mitral valve replacement done many years ago and then in 2015 October she required 3 intervention for tricuspid valve regurgitation.  At that time she did have repair of tricuspid valve with annuloplasty ring with redo sternotomy.  She does have permanent atrial fibrillation, pulmonary hypertension, single-chamber pacemaker, she does have left ventricle dysfunction.  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  She also had history of CVA.  She did have a cardiac catheterization before her surgery in 2015 which showed mild diffuse atherosclerotic changes but no critical stenosis identified.  She relocated to [place] to join her son after her significant other of 35 years past.  She is coming from [place]. Last time I seen her the goal was to put her on appropriate guideline directed medical therapy for cardiomyopathy.  I asked her to discontinue Aldactone and start Entresto.  However on the way out of our office she fell down she not going to the emergency room and she was afraid to start any new medications so if she does not feel well after the fall she would not know if this is related to her fall all to new medication.  She is coming today to talk about this.  Overall she says she is doing fine shortness of breath still there but overall not bad.  Denies  have any swelling of lower extremities no chest pain tightness squeezing pressure burning chest.  Past Medical History:  Diagnosis Date   Abdominal bruit    Acute blood loss anemia 03/09/2017   Anemia, unspecified 04/23/2008   Formatting of this note might be different from the original. Work up done 04/2008 showed normal Vit S31, folic acid, iron studies and no monoclonal protein. Normal sed rate. Most likely anemia due to renal insufficiency. Bone marrow biopsy performed 01/06/2011 showed variably cellular marrow with normocellular and hypocellular areas with no dysplastic changes. Adequate megakaryocytes. No malignanc   Atrial fibrillation (HCC)    Atrial fibrillation, chronic ([place]) 03/09/2017   Bradycardia 04/09/2020   Formatting of this note might be different from the original. Pacemaker implanted   Last Assessment & Plan:  Formatting of this note might be different from the original. S/p pacemaker   Bronchiectasis without complication ([place]) 5/94/5859   Bronchitis    CHF (congestive heart failure) (HCC)    Chronic renal failure, stage 3 (moderate) ([place]) 02/19/2020   Congestive heart failure ([place]) 02/19/2020   Cough 11/01/2012   CVA (cerebral vascular accident) ([place]) 11/05/2009   Dyspnea on exertion 05/16/2010   Last Assessment & Plan:  Formatting of this note might be different from the original. Stable at this time.   Encounter for therapeutic drug level monitoring 12/21/2017   Essential hypertension 03/09/2017   Family history of malignant neoplasm of breast 05/26/2008   Formatting of this note might  be different from the original. Mother died at age 21, sister diagnosed at age 20 with breast cancer. Maternal aunt with abdominal cancer(per patient not ovarian). Maternal grandmother died age 51 of CVA. Declined genetic councelling   GI bleed 03/09/2017   HLD (hyperlipidemia) 03/09/2017   Hyperlipidemia    Hypertension    Hypothyroidism 03/09/2017   Liver cirrhosis ([place])    Long term (current)  use of anticoagulants 10/30/2013   Last Assessment & Plan:  Formatting of this note is different from the original. No bleeding issues. Continue with warfarin  INR, Capillary (no units)  Date Value  06/16/2019 2.0   LV dysfunction 07/05/2012   Formatting of this note might be different from the original. EF 40-45% per echo 05/2012  Echo 01/2015 LVEF 44%  06/2016 LVEF 45%   Last Assessment & Plan:  Formatting of this note might be different from the original. LVEF 45% per echo 06/2016. Currently asymptomatic. Will continue observation with a repeat echo. Remain on BB, aldactone and ARB regimen.   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 Thrombocytopenia documented since 2005, mild, chronic. 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 US of the abdomen 01/2008 showed spleen measuring 16 cm.. 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 Struggling with nerve injury resulting in dominant L hand weakness and numbness. Quite depressed about the situation. Encouraged her to continue with therapy.   Pulmonary hypertension ([place]) 02/19/2020   Rectal bleeding 03/09/2017   Splenomegaly 05/26/2008   Formatting of this note might be different from the original. Noted on 01/2008 renal ultrasound measuring 16X6.5 cm   Status post mitral valve replacement with bioprosthetic valve 04/09/2020   Formatting of this note is different from the original. 11/2013 Barb Merino) Redo sternotomy, redo mitral valve surgery consisting of mitral valve replacement with a size 27 Hancock  II porcine bioprosthesis, tricuspid valve repair utilizing a size 36 Carpentier-Edwards Classic tricuspid annuloplasty ring through a redo sternotomy.  06/22/16 Echocardiogram Mildly decreased left ventricular systolic func   Thyroid disease    Tremor 02/19/2020   Vitamin D deficiency 02/19/2020    Past Surgical History:  Procedure Laterality Date   [place]   MITRAL VALVE REPLACEMENT  11/15/2013   bioprosthetic valve (redo)   MITRAL VALVE REPLACEMENT  1998   PACEMAKER INSERTION  07/07/2018   TRICUSPID VALVE SURGERY  11/2013   repair   VASCULAR SURGERY Left    had repair of aneurysm left arm following removal of central line    Current Medications: Current Meds  Medication Sig   carvedilol (COREG) 6.25 MG tablet Take 1 tablet (6.25 mg total) by mouth 2 (two) times daily with a meal.   Cholecalciferol 5000 units TABS Take 5,000 Units by mouth daily.   cholestyramine (QUESTRAN) 4 g packet Take 1 packet (4 g total) by mouth 3 (three) times daily with meals. (Patient taking differently: Take 4 g by mouth as needed (IBS).)   ezetimibe (ZETIA) 10 MG tablet Take 1 tablet (10 mg total) by mouth daily.  furosemide (LASIX) 40 MG tablet Take 1 tablet (40 mg total) by mouth daily.   levothyroxine (SYNTHROID) 200 MCG tablet Take 1 tablet (200 mcg total) by mouth daily.   levothyroxine (SYNTHROID) 25 MCG tablet Take 1 tablet (25 mcg total) by mouth daily.   Multiple Vitamins-Minerals (PRESERVISION AREDS PO) Take 1 tablet by mouth 2 (two) times daily. Unknown strength   sacubitril-valsartan (ENTRESTO) 24-26 MG Take 1 tablet by mouth 2 (two) times daily.   traZODone (DESYREL) 100 MG tablet Take 1 tablet (100 mg total) by mouth at bedtime.   warfarin (COUMADIN) 5 MG tablet Take as directed by mouth (Patient taking differently: Take by mouth daily at 4 PM. Take as directed by mouth)   [DISCONTINUED] spironolactone (ALDACTONE) 50 MG tablet Take 1 tablet (50 mg  total) by mouth daily.     Allergies:   Patient has no known allergies.   Social History   Socioeconomic History   Marital status: Widowed    Spouse name: Not on file   Number of children: Not on file   Years of education: Not on file   Highest education level: Not on file  Occupational History   Not on file  Tobacco Use   Smoking status: Never   Smokeless tobacco: Never  Vaping Use   Vaping Use: Never used  Substance and Sexual Activity   Alcohol use: Yes    Comment: rare alcohol   Drug use: No   Sexual activity: Not Currently  Other Topics Concern   Not on file  Social History Narrative   Husband died in 04/04/2019   Retired respiratory therapist   One daughter died at age 47   One living son, lives with im.   She belongs to the [employer]   No pets   Social Determinants of Health   Financial Resource Strain: Not on file  Food Insecurity: Not on file  Transportation Needs: Not on file  Physical Activity: Not on file  Stress: Not on file  Social Connections: Not on file     Family History: The patient's family history includes Breast cancer in her mother; Diabetes Mellitus I (age of onset: 64) in her daughter; Hyperlipidemia in her sister. ROS:   Please see the history of present illness.    All 14 point review of systems negative except as described per history of present illness  EKGs/Labs/Other Studies Reviewed:      Recent Labs: 04/17/2020: NT-Pro BNP 9,430 06/10/2020: ALT 14; B Natriuretic Peptide 707.8; BUN 35; Creatinine, Ser 1.55; Hemoglobin 11.3; Platelets 90; Potassium 4.1; Sodium 138 08/21/2020: TSH 19.22  Recent Lipid Panel    Component Value Date/Time   CHOL 148 02/19/2020 1104   TRIG 103.0 02/19/2020 1104   HDL 44.60 02/19/2020 1104   CHOLHDL 3 02/19/2020 1104   VLDL 20.6 02/19/2020 1104   LDLCALC 83 02/19/2020 1104    Physical Exam:    VS:  BP 104/62 (BP Location: Right Arm, Patient Position: Sitting)   Pulse 62   Ht 5'  7" (1.702 m)   Wt 168 lb (76.2 kg)   SpO2 94%   BMI 26.31 kg/m     Wt Readings from Last 3 Encounters:  09/20/20 168 lb (76.2 kg)  08/21/20 166 lb 9.6 oz (75.6 kg)  06/28/20 166 lb (75.3 kg)     GEN:  Well nourished, well developed in no acute distress HEENT: Normal NECK: No JVD; No carotid bruits LYMPHATICS: No lymphadenopathy CARDIAC: RRR, no murmurs, no  rubs, no gallops RESPIRATORY:  Clear to auscultation without rales, wheezing or rhonchi  ABDOMEN: Soft, non-tender, non-distended MUSCULOSKELETAL:  No edema; No deformity  SKIN: Warm and dry LOWER EXTREMITIES: no swelling NEUROLOGIC:  Alert and oriented x 3 PSYCHIATRIC:  Normal affect   ASSESSMENT:    1. Medication management   2. Chronic combined systolic and diastolic congestive heart failure ([place])   3. LV dysfunction   4. Bradycardia    PLAN:    In order of problems listed above:  Chronic combined systolic and diastolic congestive heart failure.  I was able to convince her to stop Aldactone and go on Entresto.  I warned her about potential side effects.  In about a week we will recheck her Chem-7.  I see her back in my office within the next few weeks to titrate her medications. LV dysfunction she is already on beta-blocker will discontinue Aldactone then will start Entresto if Delene Loll will not cause any electrolyte imbalance as well as hypotension then will reinitiate Aldactone. Bradycardia denies having any now.   Medication Adjustments/Labs and Tests Ordered: Current medicines are reviewed at length with the patient today.  Concerns regarding medicines are outlined above.  Orders Placed This Encounter  Procedures   Basic Metabolic Panel (BMET)   Medication changes:  Meds ordered this encounter  Medications   sacubitril-valsartan (ENTRESTO) 24-26 MG    Sig: Take 1 tablet by mouth 2 (two) times daily.    Dispense:  180 tablet    Refill:  3    Signed, Park Liter, MD, [facility] 09/20/2020 12:24 PM     [facility]

## 2020-09-20 NOTE — Patient Instructions (Signed)
Medication Instructions:  Your physician has recommended you make the following change in your medication:  STOP: Aldactone  START: Entresto 24-26 mg twice daily *If you need a refill on your cardiac medications before your next appointment, please call your pharmacy*   Lab Work: Your physician recommends that you return for lab work in:  In 1 week: BMET If you have labs (blood work) drawn today and your tests are completely normal, you will receive your results only by: [employer] (if you have MyChart) OR A paper copy in the mail If you have any lab test that is abnormal or we need to change your treatment, we will call you to review the results.   Testing/Procedures: None   Follow-Up: At [facility], you and your health needs are our priority.  As part of our continuing mission to provide you with exceptional heart care, we have created designated Provider Care Teams.  These Care Teams include your primary Cardiologist (physician) and Advanced Practice Providers (APPs -  Physician Assistants and Nurse Practitioners) who all work together to provide you with the care you need, when you need it.  We recommend signing up for the patient portal called "MyChart".  Sign up information is provided on this After Visit Summary.  MyChart is used to connect with patients for Virtual Visits (Telemedicine).  Patients are able to view lab/test results, encounter notes, upcoming appointments, etc.  Non-urgent messages can be sent to your provider as well.   To learn more about what you can do with MyChart, go to [email or web address].    Your next appointment:   6 week(s)  The format for your next appointment:   In Person  Provider:   Jenne Campus, MD   Other Instructions

## 2020-09-27 ENCOUNTER — Ambulatory Visit (INDEPENDENT_AMBULATORY_CARE_PROVIDER_SITE_OTHER): Payer: Medicare Other

## 2020-09-27 ENCOUNTER — Ambulatory Visit: Payer: Medicare Other

## 2020-09-27 ENCOUNTER — Other Ambulatory Visit: Payer: Self-pay

## 2020-09-27 DIAGNOSIS — Z7901 Long term (current) use of anticoagulants: Secondary | ICD-10-CM

## 2020-09-27 LAB — POCT INR: INR: 2.6 (ref 2.0–3.0)

## 2020-09-27 NOTE — Progress Notes (Signed)
Pt here for INR check per Jeri Lager' [facility]  Goal INR = 2.0-3.0  Last INR = 3.9  Pt currently takes Coumadin 5 mg daily except 2.5 mg Wednesdays and Saturdays.   Pt denies recent antibiotics, no dietary changes and no unusual bruising / bleeding.  INR today = 2.6  Follow up scheduled.

## 2020-09-30 ENCOUNTER — Telehealth: Payer: Self-pay

## 2020-09-30 ENCOUNTER — Ambulatory Visit: Payer: Medicare Other | Admitting: Family

## 2020-09-30 NOTE — Telephone Encounter (Signed)
Patient advised of results and provider's comment

## 2020-09-30 NOTE — Telephone Encounter (Signed)
-----   Message from [name], NP sent at 09/29/2020  6:04 PM EDT ----- Please advise pt that her INR is at goal. Continue current dose of coumadin and follow up in 1 monht as scheduled for INR.  ----- Message ----- From: Loleta Chance, CMA Sent: 09/27/2020   2:38 PM EDT To: [name], NP

## 2020-10-02 DIAGNOSIS — I10 Essential (primary) hypertension: Secondary | ICD-10-CM | POA: Insufficient documentation

## 2020-10-02 DIAGNOSIS — I2510 Atherosclerotic heart disease of native coronary artery without angina pectoris: Secondary | ICD-10-CM | POA: Insufficient documentation

## 2020-10-02 DIAGNOSIS — K589 Irritable bowel syndrome without diarrhea: Secondary | ICD-10-CM | POA: Insufficient documentation

## 2020-10-02 DIAGNOSIS — I2721 Secondary pulmonary arterial hypertension: Secondary | ICD-10-CM | POA: Insufficient documentation

## 2020-10-02 DIAGNOSIS — M810 Age-related osteoporosis without current pathological fracture: Secondary | ICD-10-CM | POA: Insufficient documentation

## 2020-10-29 ENCOUNTER — Ambulatory Visit: Payer: Medicare Other

## 2020-10-29 NOTE — Progress Notes (Deleted)
Pt here for INR check per Jeri Lager' Sullivan  Goal INR = 2.0-3.0  Last INR = 2.6  Pt currently takes Coumadin 5 mg daily except 2.5 mg Wednesdays and Saturdays.   Pt denies recent antibiotics, no dietary changes and no unusual bruising / bleeding.  INR today =   Pt advised per

## 2020-11-13 DIAGNOSIS — R0989 Other specified symptoms and signs involving the circulatory and respiratory systems: Secondary | ICD-10-CM | POA: Insufficient documentation

## 2020-11-22 ENCOUNTER — Ambulatory Visit: Payer: Medicare Other | Admitting: Family

## 2020-11-25 ENCOUNTER — Ambulatory Visit: Payer: Medicare Other | Admitting: Family

## 2020-11-25 ENCOUNTER — Other Ambulatory Visit: Payer: Self-pay

## 2020-11-27 ENCOUNTER — Other Ambulatory Visit: Payer: Self-pay

## 2020-11-27 ENCOUNTER — Encounter: Payer: Self-pay | Admitting: Cardiology

## 2020-11-27 ENCOUNTER — Ambulatory Visit (INDEPENDENT_AMBULATORY_CARE_PROVIDER_SITE_OTHER): Payer: Medicare Other

## 2020-11-27 ENCOUNTER — Ambulatory Visit (INDEPENDENT_AMBULATORY_CARE_PROVIDER_SITE_OTHER): Payer: Medicare Other | Admitting: Cardiology

## 2020-11-27 ENCOUNTER — Other Ambulatory Visit (HOSPITAL_BASED_OUTPATIENT_CLINIC_OR_DEPARTMENT_OTHER): Payer: Self-pay

## 2020-11-27 ENCOUNTER — Telehealth: Payer: Self-pay | Admitting: Cardiology

## 2020-11-27 VITALS — BP 134/78 | HR 75 | Ht 67.0 in | Wt 170.0 lb

## 2020-11-27 DIAGNOSIS — R42 Dizziness and giddiness: Secondary | ICD-10-CM

## 2020-11-27 DIAGNOSIS — I420 Dilated cardiomyopathy: Secondary | ICD-10-CM | POA: Diagnosis not present

## 2020-11-27 DIAGNOSIS — I5042 Chronic combined systolic (congestive) and diastolic (congestive) heart failure: Secondary | ICD-10-CM

## 2020-11-27 DIAGNOSIS — I10 Essential (primary) hypertension: Secondary | ICD-10-CM

## 2020-11-27 DIAGNOSIS — I482 Chronic atrial fibrillation, unspecified: Secondary | ICD-10-CM | POA: Diagnosis not present

## 2020-11-27 MED ORDER — LOSARTAN POTASSIUM 25 MG PO TABS: 25.0000 mg | ORAL_TABLET | Freq: Two times a day (BID) | ORAL | 3 refills | Status: DC

## 2020-11-27 MED ORDER — LOSARTAN POTASSIUM 25 MG PO TABS
25.0000 mg | ORAL_TABLET | Freq: Two times a day (BID) | ORAL | 3 refills | Status: DC
Start: 2020-11-27 — End: 2020-11-27

## 2020-11-27 NOTE — Telephone Encounter (Signed)
Medication resent

## 2020-11-27 NOTE — Progress Notes (Signed)
Cardiology Office Note:    Date:  11/27/2020   ID:  Annette Powers, DOB 1935/12/03, MRN [id number]  PCP:  [name], NP  Cardiologist:  Jenne Campus, MD    Referring MD: [name], NP   Chief Complaint  Patient presents with   Follow-up  Not doing well  History of Present Illness:    Annette Powers is a 85 y.o. 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  She did have mitral valve replacement done many years ago and then in 2015 October she required 3 intervention for tricuspid valve regurgitation.  At that time she did have repair of tricuspid valve with annuloplasty ring with redo sternotomy.  She does have permanent atrial fibrillation, pulmonary hypertension, single-chamber pacemaker, she does have left ventricle dysfunction.  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  She also had history of CVA.  She did have a cardiac catheterization before her surgery in 2015 which showed mild diffuse atherosclerotic changes but no critical stenosis identified.  She relocated to [place] to join her son after her significant other of 35 years past.  She is coming from [place]. Last time I seen her the goal was to put her on appropriate guideline directed medical therapy for cardiomyopathy.  I asked her to discontinue Aldactone and start Entresto.   She is coming today 2 months of follow-up.  She moved in the relocated to a different town about 50 miles away from here she started taking Entresto for about a month with no difficulties and then she started complaining of having back pain and leg pain.  She thinks is related to [facility].  She stopped it 2 days later she was fine.  I am worried that maybe her symptoms were related to the fact that she was carrying a lot of heavy objects while moving a [place] created problem.  Anyway I think we  will have to rechallenge her with Delene Loll but I want to give her a break I gave her losartan 25 twice daily today.  Past Medical History:  Diagnosis Date   Abdominal bruit    Acute blood loss anemia 03/09/2017   Anemia, unspecified 04/23/2008   Formatting of this note might be different from the original. Work up done 04/2008 showed normal Vit J82, folic acid, iron studies and no monoclonal protein. Normal sed rate. Most likely anemia due to renal insufficiency. Bone marrow biopsy performed 01/06/2011 showed variably cellular marrow with normocellular and hypocellular areas with no dysplastic changes. Adequate megakaryocytes. No malignanc   Atrial fibrillation (HCC)    Atrial fibrillation, chronic ([place]) 03/09/2017   Bradycardia 04/09/2020   Formatting of this note might be different from the original. Pacemaker implanted   Last Assessment & Plan:  Formatting of this note might be different from the original. S/p pacemaker   Bronchiectasis without complication ([place]) 06/14/3974   Bronchitis    CHF (congestive heart failure) (HCC)    Chronic renal failure, stage 3 (moderate) ([place]) 02/19/2020   Congestive heart failure ([place]) 02/19/2020   Cough 11/01/2012   CVA (cerebral vascular accident) ([place]) 11/05/2009   Dyspnea on exertion 05/16/2010   Last Assessment & Plan:  Formatting of this note might be different from the original. Stable at this time.   Encounter for therapeutic drug level monitoring 12/21/2017   Essential hypertension 03/09/2017   Family history of malignant neoplasm of breast 05/26/2008  Formatting of this note might be different from the original. Mother died at age 58, sister diagnosed at age 28 with breast cancer. Maternal aunt with abdominal cancer(per patient not ovarian). Maternal grandmother died age 77 of CVA. Declined genetic councelling   GI bleed 03/09/2017   HLD (hyperlipidemia) 03/09/2017   Hyperlipidemia    Hypertension    Hypothyroidism 03/09/2017   Liver cirrhosis ([place])     Long term (current) use of anticoagulants 10/30/2013   Last Assessment & Plan:  Formatting of this note is different from the original. No bleeding issues. Continue with warfarin  INR, Capillary (no units)  Date Value  06/16/2019 2.0   LV dysfunction 07/05/2012   Formatting of this note might be different from the original. EF 40-45% per echo 05/2012  Echo 01/2015 LVEF 44%  06/2016 LVEF 45%   Last Assessment & Plan:  Formatting of this note might be different from the original. LVEF 45% per echo 06/2016. Currently asymptomatic. Will continue observation with a repeat echo. Remain on BB, aldactone and ARB regimen.   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 Thrombocytopenia documented since 2005, mild, chronic. 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 US of the abdomen 01/2008 showed spleen measuring 16 cm.. 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 Struggling with nerve injury resulting in dominant L hand weakness and numbness. Quite depressed about the situation. Encouraged her to continue with therapy.   Pulmonary hypertension ([place]) 02/19/2020   Rectal bleeding 03/09/2017   Splenomegaly 05/26/2008   Formatting of this note might be different from the original. Noted on 01/2008 renal ultrasound measuring 16X6.5 cm   Status post mitral valve replacement with bioprosthetic valve 04/09/2020   Formatting of this note is different from the original. 11/2013 Barb Merino) Redo sternotomy, redo mitral valve surgery consisting of mitral valve replacement  with a size 27 Hancock II porcine bioprosthesis, tricuspid valve repair utilizing a size 36 Carpentier-Edwards Classic tricuspid annuloplasty ring through a redo sternotomy.  06/22/16 Echocardiogram Mildly decreased left ventricular systolic func   Thyroid disease    Tremor 02/19/2020   Vitamin D deficiency 02/19/2020    Past Surgical History:  Procedure Laterality Date   [place]   MITRAL VALVE REPLACEMENT  11/15/2013   bioprosthetic valve (redo)   MITRAL VALVE REPLACEMENT  1998   PACEMAKER INSERTION  07/07/2018   TRICUSPID VALVE SURGERY  11/2013   repair   VASCULAR SURGERY Left    had repair of aneurysm left arm following removal of central line    Current Medications: Current Meds  Medication Sig   carvedilol (COREG) 6.25 MG tablet Take 1 tablet (6.25 mg total) by mouth 2 (two) times daily with a meal.   Cholecalciferol 5000 units TABS Take 5,000 Units by mouth daily.   cholestyramine (QUESTRAN) 4 g packet Take 1 packet (4 g total) by mouth 3 (three) times daily with meals. (Patient taking differently: Take 4 g by mouth as needed (IBS).)   ezetimibe (ZETIA) 10 MG tablet Take 1 tablet (10 mg total)  by mouth daily.   furosemide (LASIX) 40 MG tablet Take 1 tablet (40 mg total) by mouth daily.   levothyroxine (SYNTHROID) 200 MCG tablet Take 1 tablet (200 mcg total) by mouth daily.   levothyroxine (SYNTHROID) 25 MCG tablet Take 1 tablet (25 mcg total) by mouth daily.   Multiple Vitamins-Minerals (PRESERVISION AREDS PO) Take 1 tablet by mouth 2 (two) times daily. Unknown strength   warfarin (COUMADIN) 5 MG tablet Take as directed by mouth (Patient taking differently: Take 5 mg by mouth See admin instructions. Wednesday and Friday  2.5 at night all other days 27m at night)     Allergies:   EDelene Loll[sacubitril-valsartan]   Social History   Socioeconomic History   Marital status: Widowed    Spouse name: Not on file   Number of children: Not  on file   Years of education: Not on file   Highest education level: Not on file  Occupational History   Not on file  Tobacco Use   Smoking status: Never   Smokeless tobacco: Never  Vaping Use   Vaping Use: Never used  Substance and Sexual Activity   Alcohol use: Yes    Comment: rare alcohol   Drug use: No   Sexual activity: Not Currently  Other Topics Concern   Not on file  Social History Narrative   Husband died in [address]/02/12  Retired respiratory therapist   One daughter died at age 112  One living son, lives with im.   She belongs to the [employer]   No pets   Social Determinants of Health   Financial Resource Strain: Not on file  Food Insecurity: Not on file  Transportation Needs: Not on file  Physical Activity: Not on file  Stress: Not on file  Social Connections: Not on file     Family History: The patient's family history includes Breast cancer in her mother; Diabetes Mellitus I (age of onset: 28 in her daughter; Hyperlipidemia in her sister. ROS:   Please see the history of present illness.    All 14 point review of systems negative except as described per history of present illness  EKGs/Labs/Other Studies Reviewed:      Recent Labs: 04/17/2020: NT-Pro BNP 9,430 06/10/2020: ALT 14; B Natriuretic Peptide 707.8; BUN 35; Creatinine, Ser 1.55; Hemoglobin 11.3; Platelets 90; Potassium 4.1; Sodium 138 09/20/2020: TSH 1.65  Recent Lipid Panel    Component Value Date/Time   CHOL 148 02/19/2020 1104   TRIG 103.0 02/19/2020 1104   HDL 44.60 02/19/2020 1104   CHOLHDL 3 02/19/2020 1104   VLDL 20.6 02/19/2020 1104   LDLCALC 83 02/19/2020 1104    Physical Exam:    VS:  BP 134/78 (BP Location: Right Arm, Patient Position: Sitting)   Pulse 75   Ht _0  (1.702 m)   Wt 170 lb (77.1 kg)   SpO2 97%   BMI 26.63 kg/m     Wt Readings from Last 3 Encounters:  11/27/20 170 lb (77.1 kg)  09/20/20 168 lb (76.2 kg)  08/21/20 166 lb 9.6 oz (75.6 kg)      GEN:  Well nourished, well developed in no acute distress HEENT: Normal NECK: No JVD; No carotid bruits LYMPHATICS: No lymphadenopathy CARDIAC: RRR, irregular irregular ORY:  Clear to auscultation without rales, wheezing or rhonchi  ABDOMEN: Soft, non-tender, non-distended MUSCULOSKELETAL:  No edema; No deformity  SKIN: Warm and dry LOWER EXTREMITIES: no swelling NEUROLOGIC:  Alert and oriented x 3 PSYCHIATRIC:  Normal affect   ASSESSMENT:    1. Atrial fibrillation, chronic ([place])   2. Essential hypertension   3. Chronic combined systolic and diastolic congestive heart failure ([place])   4. Dilated cardiomyopathy ([place])   5. Dizziness    PLAN:    In order of problems listed above:  Cardiomyopathy which is difficult to manage so far she is on beta-blocker in form of carvedilol which I will continue.  I try to put her on Entresto but she developed some unusual instrument side effect.  At the same time when she developed those symptoms meaning back pain she was moving and I am worried that she probably has some injury to her back and lower extremities secondary to lifting heavy object rather than Entresto.  The plan will be to put her on losartan 25 twice daily.  #10 rechallenge with Entresto in about a month.  She relocated to a different town 27 miles away told her that ideally she need to find a cardiologist there if not I will be more than glad to see her I also told her We can do virtual visit if she wished. Essential hypertension blood pressure well controlled continue present management Permanent atrial fibrillation.  Stable.  Noted.  On anticoagulation which I will continue Dizziness actually she had 1 this is post when she was sitting in my office that did happen a few times a day.  I will ask her to wear Zio patch for 1 week   Medication Adjustments/Labs and Tests Ordered: Current medicines are reviewed at length with the patient today.  Concerns regarding medicines are outlined  above.  No orders of the defined types were placed in this encounter.  Medication changes: No orders of the defined types were placed in this encounter.   Signed, Park Liter, MD, [facility] 11/27/2020 1:32 PM    [place]

## 2020-11-27 NOTE — Telephone Encounter (Signed)
RX sent. Pt aware. 

## 2020-11-27 NOTE — Patient Instructions (Signed)
Medication Instructions:  Your physician has recommended you make the following change in your medication:  Stay off entresto   START: Losartan 25 mg twice daily  *If you need a refill on your cardiac medications before your next appointment, please call your pharmacy*   Lab Work: Your physician recommends that you return for lab work today: bmp  If you have labs (blood work) drawn today and your tests are completely normal, you will receive your results only by: [place] (if you have MyChart) OR A paper copy in the mail If you have any lab test that is abnormal or we need to change your treatment, we will call you to review the results.   Testing/Procedures: A zio monitor was ordered today. It will remain on for 7 days. You will then return monitor and event diary in provided box. It takes 1-2 weeks for report to be downloaded and returned to [place]. We will call you with the results. If monitor falls off or has orange flashing light, please call Zio for further instructions.     Follow-Up: At [facility], you and your health needs are our priority.  As part of our continuing mission to provide you with exceptional heart care, we have created designated Provider Care Teams.  These Care Teams include your primary Cardiologist (physician) and Advanced Practice Providers (APPs -  Physician Assistants and Nurse Practitioners) who all work together to provide you with the care you need, when you need it.  We recommend signing up for the patient portal called "MyChart".  Sign up information is provided on this After Visit Summary.  MyChart is used to connect with patients for Virtual Visits (Telemedicine).  Patients are able to view lab/test results, encounter notes, upcoming appointments, etc.  Non-urgent messages can be sent to your provider as well.   To learn more about what you can do with MyChart, go to [email or web address].    Your next appointment:   1 month(s)  The format  for your next appointment:   In Person  Provider:   Jenne Campus, MD   Other Instructions

## 2020-11-27 NOTE — Telephone Encounter (Signed)
Pt c/o medication issue:  1. Name of Medication: losartan (COZAAR) 25 MG tablet  2. How are you currently taking this medication (dosage and times per day)? Has not started   3. Are you having a reaction (difficulty breathing--STAT)? no  4. What is your medication issue? Patient states [facility] has not received the new prescription.

## 2020-11-28 LAB — BASIC METABOLIC PANEL
BUN/Creatinine Ratio: 16 (ref 12–28)
BUN: 23 mg/dL (ref 8–27)
CO2: 28 mmol/L (ref 20–29)
Calcium: 9 mg/dL (ref 8.7–10.3)
Chloride: 108 mmol/L — ABNORMAL HIGH (ref 96–106)
Creatinine, Ser: 1.43 mg/dL — ABNORMAL HIGH (ref 0.57–1.00)
Glucose: 79 mg/dL (ref 70–99)
Potassium: 4.9 mmol/L (ref 3.5–5.2)
Sodium: 141 mmol/L (ref 134–144)
eGFR: 36 mL/min/{1.73_m2} — ABNORMAL LOW (ref >59)

## 2020-11-29 ENCOUNTER — Telehealth: Payer: Self-pay

## 2020-11-29 NOTE — Telephone Encounter (Signed)
Called patient. Patient made aware of the results and recommendations. Verbalized understanding. No questions or concerns expressed at this time.

## 2020-11-29 NOTE — Telephone Encounter (Signed)
-----   Message from Truddie Hidden, RN sent at 11/28/2020 11:18 AM EDT -----  ----- Message ----- From: Park Liter, MD Sent: 11/28/2020  10:57 AM EDT To: Truddie Hidden, RN  Kidney function stable, acceptable, continue low-dose losartan as advised

## 2021-01-07 ENCOUNTER — Encounter: Payer: Self-pay | Admitting: Cardiology

## 2021-01-07 ENCOUNTER — Ambulatory Visit (INDEPENDENT_AMBULATORY_CARE_PROVIDER_SITE_OTHER): Payer: Medicare Other | Admitting: Cardiology

## 2021-01-07 VITALS — HR 60 | Ht 67.0 in | Wt 165.0 lb

## 2021-01-07 DIAGNOSIS — I519 Heart disease, unspecified: Secondary | ICD-10-CM | POA: Diagnosis not present

## 2021-01-07 DIAGNOSIS — I5042 Chronic combined systolic (congestive) and diastolic (congestive) heart failure: Secondary | ICD-10-CM | POA: Diagnosis not present

## 2021-01-07 DIAGNOSIS — I482 Chronic atrial fibrillation, unspecified: Secondary | ICD-10-CM

## 2021-01-07 DIAGNOSIS — I10 Essential (primary) hypertension: Secondary | ICD-10-CM

## 2021-01-07 DIAGNOSIS — Z950 Presence of cardiac pacemaker: Secondary | ICD-10-CM

## 2021-01-07 MED ORDER — LISINOPRIL 2.5 MG PO TABS: 2.5000 mg | ORAL_TABLET | Freq: Every day | ORAL | 3 refills | Status: AC

## 2021-01-07 NOTE — Addendum Note (Signed)
Addended by: Jerl Santos R on: 01/07/2021 02:16 PM   Modules accepted: Orders

## 2021-01-07 NOTE — Patient Instructions (Signed)
Medication Instructions:  Your physician has recommended you make the following change in your medication: START LISINOPRIL 2.5MG  ONCE DAILY   *If you need a refill on your cardiac medications before your next appointment, please call your pharmacy*   Lab Work: Your physician recommends that you HAVE A BMET DRAWN AT YOUR PRIMARY CARE 1 WEEK AFTER TAKING LISINOPRIL   If you have labs (blood work) drawn today and your tests are completely normal, you will receive your results only by: [place] (if you have MyChart) OR A paper copy in the mail If you have any lab test that is abnormal or we need to change your treatment, we will call you to review the results.   Testing/Procedures: NONE   Follow-Up: At [facility], you and your health needs are our priority.  As part of our continuing mission to provide you with exceptional heart care, we have created designated Provider Care Teams.  These Care Teams include your primary Cardiologist (physician) and Advanced Practice Providers (APPs -  Physician Assistants and Nurse Practitioners) who all work together to provide you with the care you need, when you need it.  We recommend signing up for the patient portal called "MyChart".  Sign up information is provided on this After Visit Summary.  MyChart is used to connect with patients for Virtual Visits (Telemedicine).  Patients are able to view lab/test results, encounter notes, upcoming appointments, etc.  Non-urgent messages can be sent to your provider as well.   To learn more about what you can do with MyChart, go to [email or web address].    Your next appointment:   3 month(s)  The format for your next appointment:   In Person  Provider:   Jenne Campus, MD    Other Instructions

## 2021-01-07 NOTE — Progress Notes (Signed)
Virtual Visit via Telephone Note   This visit type was conducted due to national recommendations for restrictions regarding the COVID-19 Pandemic (e.g. social distancing) in an effort to limit this patient's exposure and mitigate transmission in our community.  Due to her co-morbid illnesses, this patient is at least at moderate risk for complications without adequate follow up.  This format is felt to be most appropriate for this patient at this time.  The patient did not have access to video technology/had technical difficulties with video requiring transitioning to audio format only (telephone).  All issues noted in this document were discussed and addressed.  No physical exam could be performed with this format.  Please refer to the patient's chart for her  consent to telehealth for [facility].  Evaluation Performed:  Follow-up visit  This visit type was conducted due to national recommendations for restrictions regarding the COVID-19 Pandemic (e.g. social distancing).  This format is felt to be most appropriate for this patient at this time.  All issues noted in this document were discussed and addressed.  No physical exam was performed (except for noted visual exam findings with Video Visits).  Please refer to the patient's chart (MyChart message for video visits and phone note for telephone visits) for the patient's consent to telehealth for [facility].  Date:  01/07/2021  ID: Annette Powers, DOB 11/30/1935, MRN [id number]   Patient Location: [place] [address]   Provider location:   [place] Office  PCP:  [name], NP  Cardiologist:  Jenne Campus, MD     Chief Complaint: Doing fine  History of Present Illness:    Annette Powers is a 85 y.o. female  who presents via audio/video conferencing for a telehealth visit today.    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  She did have mitral valve replacement done many years ago and then in 2015 October she required 3 intervention for tricuspid valve regurgitation.  At that time she did have repair of tricuspid valve with annuloplasty ring with redo sternotomy.  She does have permanent atrial fibrillation, pulmonary hypertension, single-chamber pacemaker, she does have left ventricle dysfunction.  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  She also had history of CVA.  She did have a cardiac catheterization before her surgery in 2015 which showed mild diffuse atherosclerotic changes but no critical stenosis identified.  She relocated to [place] to join her son after her significant other of 35 years past.  She is coming from [place]. Last time I seen her the goal was to put her on appropriate guideline directed medical therapy for cardiomyopathy.  I asked her to discontinue Aldactone and start Entresto.   She lives about 50 miles away and she requested video visit today however she was unable to establish video link therefore we need to have just telephone conversation.  [place] in [facility].  Overall she is doing well she try losartan however developed the same side effect that she got after Entresto like pain so she was unable to continue.  Overall however she is doing well.  She denies have any chest pain tightness squeezing pressure burning chest no palpitations no dizziness no swelling of lower extremities.   The patient does not have symptoms concerning for COVID-19 infection (fever, chills, cough, or new SHORTNESS  OF BREATH).    Prior CV studies:   The following studies were reviewed today:       Past Medical History:  Diagnosis Date   Abdominal bruit    Acute blood loss anemia 03/09/2017   Anemia, unspecified 04/23/2008   Formatting of this note might be different from the original. Work up done  04/2008 showed normal Vit E75, folic acid, iron studies and no monoclonal protein. Normal sed rate. Most likely anemia due to renal insufficiency. Bone marrow biopsy performed 01/06/2011 showed variably cellular marrow with normocellular and hypocellular areas with no dysplastic changes. Adequate megakaryocytes. No malignanc   Atrial fibrillation (HCC)    Atrial fibrillation, chronic ([place]) 03/09/2017   Bradycardia 04/09/2020   Formatting of this note might be different from the original. Pacemaker implanted   Last Assessment & Plan:  Formatting of this note might be different from the original. S/p pacemaker   Bronchiectasis without complication ([place]) 1/70/0174   Bronchitis    CHF (congestive heart failure) (HCC)    Chronic renal failure, stage 3 (moderate) ([place]) 02/19/2020   Congestive heart failure ([place]) 02/19/2020   Cough 11/01/2012   CVA (cerebral vascular accident) ([place]) 11/05/2009   Dyspnea on exertion 05/16/2010   Last Assessment & Plan:  Formatting of this note might be different from the original. Stable at this time.   Encounter for therapeutic drug level monitoring 12/21/2017   Essential hypertension 03/09/2017   Family history of malignant neoplasm of breast 05/26/2008   Formatting of this note might be different from the original. Mother died at age 17, sister diagnosed at age 53 with breast cancer. Maternal aunt with abdominal cancer(per patient not ovarian). Maternal grandmother died age 53 of CVA. Declined genetic councelling   GI bleed 03/09/2017   HLD (hyperlipidemia) 03/09/2017   Hyperlipidemia    Hypertension    Hypothyroidism 03/09/2017   Liver cirrhosis ([place])    Long term (current) use of anticoagulants 10/30/2013   Last Assessment & Plan:  Formatting of this note is different from the original. No bleeding issues. Continue with warfarin  INR, Capillary (no units)  Date Value  06/16/2019 2.0   LV dysfunction 07/05/2012   Formatting of this note might be different from the original. EF  40-45% per echo 05/2012  Echo 01/2015 LVEF 44%  06/2016 LVEF 45%   Last Assessment & Plan:  Formatting of this note might be different from the original. LVEF 45% per echo 06/2016. Currently asymptomatic. Will continue observation with a repeat echo. Remain on BB, aldactone and ARB regimen.   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 Thrombocytopenia documented since 2005, mild, chronic. 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 US of the abdomen 01/2008 showed spleen measuring 16 cm.. 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 Struggling with nerve injury resulting in dominant L hand weakness and numbness. Quite depressed about the situation. Encouraged her to continue with therapy.   Pulmonary hypertension ([place]) 02/19/2020   Rectal bleeding 03/09/2017   Splenomegaly 05/26/2008   Formatting of this note might be different from the  original. Noted on 01/2008 renal ultrasound measuring 16X6.5 cm   Status post mitral valve replacement with bioprosthetic valve 04/09/2020   Formatting of this note is different from the original. 11/2013 Barb Merino) Redo sternotomy, redo mitral valve surgery consisting of mitral valve replacement with a size 27 Hancock II porcine bioprosthesis, tricuspid valve repair utilizing a size 36 Carpentier-Edwards Classic tricuspid annuloplasty ring through a redo sternotomy.  06/22/16 Echocardiogram Mildly decreased left ventricular systolic func   Thyroid disease    Tremor 02/19/2020   Vitamin D deficiency 02/19/2020    Past  Surgical History:  Procedure Laterality Date   [place]   MITRAL VALVE REPLACEMENT  11/15/2013   bioprosthetic valve (redo)   MITRAL VALVE REPLACEMENT  1998   PACEMAKER INSERTION  07/07/2018   TRICUSPID VALVE SURGERY  11/2013   repair   VASCULAR SURGERY Left    had repair of aneurysm left arm following removal of central line     Current Meds  Medication Sig   carvedilol (COREG) 6.25 MG tablet Take 1 tablet (6.25 mg total) by mouth 2 (two) times daily with a meal.   Cholecalciferol 5000 units TABS Take 5,000 Units by mouth daily.   cholestyramine (QUESTRAN) 4 g packet Take 1 packet (4 g total) by mouth 3 (three) times daily with meals. (Patient taking differently: Take 4 g by mouth as needed (IBS).)   ezetimibe (ZETIA) 10 MG tablet Take 1 tablet (10 mg total) by mouth daily.   furosemide (LASIX) 40 MG tablet Take 1 tablet (40 mg total) by mouth daily.   levothyroxine (SYNTHROID) 200 MCG tablet Take 1 tablet (200 mcg total) by mouth daily.   levothyroxine (SYNTHROID) 25 MCG tablet Take 1 tablet (25 mcg total) by mouth daily.   losartan (COZAAR) 25 MG tablet Take 1 tablet (25 mg total) by mouth in the morning and at bedtime.   Multiple Vitamins-Minerals (PRESERVISION AREDS PO) Take 1 tablet by mouth 2 (two) times daily. Unknown strength   traZODone (DESYREL) 100 MG tablet Take 1 tablet (100 mg total) by mouth at bedtime.   warfarin (COUMADIN) 5 MG tablet Take as directed by mouth (Patient taking differently: Take 5 mg by mouth See admin instructions. Wednesday and Friday  2.5 at night all other days 7m at night)      Family History: The patient's family history includes Breast cancer in her mother; Diabetes Mellitus I (age of onset: 227 in her daughter; Hyperlipidemia in her sister.   ROS:   Please see the history of present illness.     All other systems reviewed and are negative.   Labs/Other Tests and Data Reviewed:     Recent  Labs: 04/17/2020: NT-Pro BNP 9,430 06/10/2020: ALT 14; B Natriuretic Peptide 707.8; Hemoglobin 11.3; Platelets 90 09/20/2020: TSH 1.65 11/27/2020: BUN 23; Creatinine, Ser 1.43; Potassium 4.9; Sodium 141  Recent Lipid Panel    Component Value Date/Time   CHOL 148 02/19/2020 1104   TRIG 103.0 02/19/2020 1104   HDL 44.60 02/19/2020 1104   CHOLHDL 3 02/19/2020 1104   VLDL 20.6 02/19/2020 1104   LDLCALC 83 02/19/2020 1104      Exam:    Vital Signs:  Pulse 60   Ht 5' 7"  (1.702 m)   Wt 165 lb (74.8 kg)   SpO2 97%   BMI 25.84 kg/m     Wt Readings from Last 3 Encounters:  01/07/21 165 lb (74.8 kg)  11/27/20 170 lb (77.1 kg)  09/20/20 168 lb (76.2 kg)     Well nourished, well developed in no acute distress. Alert awake and x3 sounds very good no shortness of breath denies having any swelling of lower extremities no cough  Diagnosis for this visit:   1. Atrial fibrillation, chronic ([place])   2. Essential hypertension   3. Chronic combined systolic and diastolic congestive heart failure ([place])   4. LV dysfunction   5. Pacemaker      ASSESSMENT & PLAN:    1.  Atrial fibrillation which is permanent.  Continue anticoagulation. 2.  Essential hypertension blood pressure well controlled continue present management. 3.  Congestive heart failure with cardiomyopathy trying to put on the right medication sadly she developed leg pain with Entresto I did try losartan the same side effects.  I think I will try lisinopril now only 2.5 mg daily hopefully that will allow him to tolerate this medication. 4.  Pacemaker she did see our EP team in March there was note saying that pacemaker is functioning properly and she will have to have follow-up in the clinic however I do not see any follow-up after that.  We will contact our EP team for clarification.  COVID-19 Education: The signs and symptoms of COVID-19 were discussed with the patient and how to seek care for testing (follow up with PCP or  arrange E-visit).  The importance of social distancing was discussed today.  Patient Risk:   After full review of this patients clinical status, I feel that they are at least moderate risk at this time.  Time:   Today, I have spent 15 minutes with the patient with telehealth technology discussing pt health issues.  I spent 20 minutes reviewing her chart before the visit.  Visit was finished at 1:57 PM.    Medication Adjustments/Labs and Tests Ordered: Current medicines are reviewed at length with the patient today.  Concerns regarding medicines are outlined above.  No orders of the defined types were placed in this encounter.  Medication changes: No orders of the defined types were placed in this encounter.    Disposition: 3 months  Signed, Park Liter, MD, [facility] 01/07/2021 1:55 PM    [place]

## 2021-01-13 ENCOUNTER — Ambulatory Visit

## 2021-01-14 LAB — CUP PACEART REMOTE DEVICE CHECK
Battery Remaining Longevity: 112 mo
Battery Voltage: 3.04 V
Brady Statistic RV Percent Paced: 85.22 %
Date Time Interrogation Session: 20221204234444
Implantable Lead Implant Date: 19980810
Implantable Lead Location: 753860
Implantable Lead Model: 5054
Implantable Pulse Generator Implant Date: 20200528
Lead Channel Impedance Value: 551 Ohm
Lead Channel Impedance Value: 722 Ohm
Lead Channel Sensing Intrinsic Amplitude: 12.375 mV
Lead Channel Sensing Intrinsic Amplitude: 12.375 mV
Lead Channel Setting Pacing Amplitude: 2.75 V
Lead Channel Setting Pacing Pulse Width: 0.6 ms
Lead Channel Setting Sensing Sensitivity: 1.2 mV

## 2021-02-05 ENCOUNTER — Telehealth: Payer: Self-pay | Admitting: Family

## 2021-02-05 NOTE — Telephone Encounter (Signed)
Left message for patient to call back and schedule Medicare Annual Wellness Visit (AWV) in office.  ° °If not able to come in office, please offer to do virtually or by telephone.  Left office number and my jabber #[phone]. ° °Due for AWVI ° °Please schedule at anytime with Nurse Health Advisor. °  °

## 2021-04-02 ENCOUNTER — Other Ambulatory Visit: Payer: Self-pay | Admitting: Cardiology

## 2021-04-02 ENCOUNTER — Telehealth: Payer: Self-pay

## 2021-04-02 MED ORDER — WARFARIN SODIUM 5 MG PO TABS: ORAL_TABLET | ORAL | 0 refills | Status: AC

## 2021-04-02 NOTE — Telephone Encounter (Signed)
°*  STAT* If patient is at the pharmacy, call can be transferred to refill team.   1. Which medications need to be refilled? (please list name of each medication and dose if known)  warfarin (COUMADIN) 5 MG tablet  2. Which pharmacy/location (including street and city if local pharmacy) is medication to be sent to? [facility], [place], [place] [address]  3. Do they need a 30 day or 90 day supply?   15 day supply, to last patient until 3/06 appointment with Dr. Agustin Cree. Patient currently has enough medication for about 5 days.

## 2021-04-02 NOTE — Telephone Encounter (Signed)
I spoke to patient who needs Warfarin refill, but we do not follow patient's INR.    She will have OV with Dr. Agustin Cree in early March to discuss covering patient.  I filled her Warfarin prescription up until then.

## 2021-04-11 ENCOUNTER — Telehealth: Payer: Self-pay | Admitting: Family

## 2021-04-11 NOTE — Telephone Encounter (Signed)
Left message for patient to call back and schedule Medicare Annual Wellness Visit (AWV) in office.  ? ?If not able to come in office, please offer to do virtually or by telephone.  Left office number and my jabber [phone]. ? ?AWVI eligible as of 08/10/2019 ? ?Please schedule at anytime with Nurse Health Advisor. ?  ?

## 2021-04-14 ENCOUNTER — Ambulatory Visit (INDEPENDENT_AMBULATORY_CARE_PROVIDER_SITE_OTHER): Payer: Medicare Other

## 2021-04-14 ENCOUNTER — Other Ambulatory Visit: Payer: Self-pay

## 2021-04-14 ENCOUNTER — Encounter: Payer: Self-pay | Admitting: Cardiology

## 2021-04-14 ENCOUNTER — Ambulatory Visit (INDEPENDENT_AMBULATORY_CARE_PROVIDER_SITE_OTHER): Payer: Medicare Other | Admitting: Cardiology

## 2021-04-14 VITALS — BP 134/82 | HR 75 | Ht 67.0 in | Wt 167.0 lb

## 2021-04-14 DIAGNOSIS — I482 Chronic atrial fibrillation, unspecified: Secondary | ICD-10-CM

## 2021-04-14 DIAGNOSIS — Z953 Presence of xenogenic heart valve: Secondary | ICD-10-CM

## 2021-04-14 DIAGNOSIS — I10 Essential (primary) hypertension: Secondary | ICD-10-CM

## 2021-04-14 DIAGNOSIS — I272 Pulmonary hypertension, unspecified: Secondary | ICD-10-CM

## 2021-04-14 DIAGNOSIS — E782 Mixed hyperlipidemia: Secondary | ICD-10-CM

## 2021-04-14 NOTE — Patient Instructions (Signed)
Medication Instructions:  ?Your physician recommends that you continue on your current medications as directed. Please refer to the Current Medication list given to you today. ? ?*If you need a refill on your cardiac medications before your next appointment, please call your pharmacy* ? ? ?Lab Work: ?Your physician recommends that you return for lab work in:  ? ?Labs today: CBC, CMP, TSH, Lipid, PT/INR ? ?If you have labs (blood work) drawn today and your tests are completely normal, you will receive your results only by: ?MyChart Message (if you have MyChart) OR ?A paper copy in the mail ?If you have any lab test that is abnormal or we need to change your treatment, we will call you to review the results. ? ? ?Testing/Procedures: ?Your physician has requested that you have an echocardiogram. Echocardiography is a painless test that uses sound waves to create images of your heart. It provides your doctor with information about the size and shape of your heart and how well your heart?s chambers and valves are working. This procedure takes approximately one hour. There are no restrictions for this procedure. ? ? ?Follow-Up: ?At [facility], you and your health needs are our priority.  As part of our continuing mission to provide you with exceptional heart care, we have created designated Provider Care Teams.  These Care Teams include your primary Cardiologist (physician) and Advanced Practice Providers (APPs -  Physician Assistants and Nurse Practitioners) who all work together to provide you with the care you need, when you need it. ? ?We recommend signing up for the patient portal called "MyChart".  Sign up information is provided on this After Visit Summary.  MyChart is used to connect with patients for Virtual Visits (Telemedicine).  Patients are able to view lab/test results, encounter notes, upcoming appointments, etc.  Non-urgent messages can be sent to your provider as well.   ?To learn more about what you  can do with MyChart, go to [email or web address].   ? ?Your next appointment:   ?5 month(s) ? ?The format for your next appointment:   ?In Person ? ?Provider:   ?Jenne Campus, MD  ? ? ?Other Instructions ?None ? ?

## 2021-04-14 NOTE — Progress Notes (Signed)
Cardiology Office Note:    Date:  04/14/2021   ID:  Annette Powers, DOB 1935/03/20, MRN [id number]  PCP:  [name], NP  Cardiologist:  Jenne Campus, MD    Referring MD: [name], NP   Chief Complaint  Patient presents with   Follow-up  I am doing fine but I am frustrated with my life situation  History of Present Illness:    Annette Powers is a 86 y.o. female with past medical history significant for mitral valve replacement biological valve many years ago, and in 2015 she required another surgery which was repair of tricuspid valve with annuloplasty.  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  She also had history of CVA, cardiac catheterization done in 2015 before second surgery showed mild diffuse atherosclerotic changes but no critical lesion she leaves 50 miles away from here and she is very unhappy there.  She cannot find any doctor over there and she drove actually today hold his distance to see me.  She described to have some fatigue shortness of breath but the major issue is her situation over there.  She tells me that she is a city care and [place].  There is minimal swelling of lower extremities no palpitations no chest pain tightness squeezing pressure burning chest.  Past Medical History:  Diagnosis Date   Abdominal bruit    Acute blood loss anemia 03/09/2017   Anemia, unspecified 04/23/2008   Formatting of this note might be different from the original. Work up done 04/2008 showed normal Vit T02, folic acid, iron studies and no monoclonal protein. Normal sed rate. Most likely anemia due to renal insufficiency. Bone marrow biopsy performed 01/06/2011 showed variably cellular marrow with normocellular and hypocellular areas with no dysplastic changes. Adequate megakaryocytes. No malignanc   Atrial fibrillation (HCC)    Atrial fibrillation, chronic ([place]) 03/09/2017   Bradycardia  04/09/2020   Formatting of this note might be different from the original. Pacemaker implanted   Last Assessment & Plan:  Formatting of this note might be different from the original. S/p pacemaker   Bronchiectasis without complication ([place]) 02/19/7354   Bronchitis    CHF (congestive heart failure) (HCC)    Chronic renal failure, stage 3 (moderate) ([place]) 02/19/2020   Congestive heart failure ([place]) 02/19/2020   Cough 11/01/2012   CVA (cerebral vascular accident) ([place]) 11/05/2009   Dyspnea on exertion 05/16/2010   Last Assessment & Plan:  Formatting of this note might be different from the original. Stable at this time.   Encounter for therapeutic drug level monitoring 12/21/2017   Essential hypertension 03/09/2017   Family history of malignant neoplasm of breast 05/26/2008   Formatting of this note might be different from the original. Mother died at age 7, sister diagnosed at age 73 with breast cancer. Maternal aunt with abdominal cancer(per patient not ovarian). Maternal grandmother died age 52 of CVA. Declined genetic councelling   GI bleed 03/09/2017   HLD (hyperlipidemia) 03/09/2017   Hyperlipidemia    Hypertension    Hypothyroidism 03/09/2017   Liver cirrhosis ([place])    Long term (current) use of anticoagulants 10/30/2013   Last Assessment & Plan:  Formatting of this note is different from the original. No bleeding issues. Continue with warfarin  INR, Capillary (no units)  Date Value  06/16/2019 2.0   LV dysfunction 07/05/2012   Formatting of this note might be different from the original. EF 40-45% per echo  05/2012  Echo 01/2015 LVEF 44%  06/2016 LVEF 45%   Last Assessment & Plan:  Formatting of this note might be different from the original. LVEF 45% per echo 06/2016. Currently asymptomatic. Will continue observation with a repeat echo. Remain on BB, aldactone and ARB regimen.   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 Thrombocytopenia documented since 2005, mild, chronic. 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 US of the abdomen 01/2008 showed spleen measuring 16 cm.. 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 Struggling with nerve injury resulting in dominant L hand weakness and numbness. Quite depressed about the situation. Encouraged her to continue with therapy.   Pulmonary hypertension ([place]) 02/19/2020   Rectal bleeding 03/09/2017   Splenomegaly 05/26/2008   Formatting of this note might be different from the original. Noted on 01/2008 renal ultrasound measuring 16X6.5 cm   Status post mitral valve replacement with bioprosthetic valve 04/09/2020   Formatting of this note is different from the original. 11/2013 Barb Merino) Redo sternotomy, redo mitral valve surgery consisting of mitral valve replacement with a size 27 Hancock II porcine bioprosthesis, tricuspid valve repair utilizing a size 36 Carpentier-Edwards Classic tricuspid annuloplasty ring through a redo sternotomy.  06/22/16 Echocardiogram Mildly decreased left ventricular systolic func   Thyroid disease    Tremor 02/19/2020   Vitamin D deficiency 02/19/2020    Past Surgical History:  Procedure Laterality Date   [place]   MITRAL VALVE REPLACEMENT  11/15/2013   bioprosthetic valve (redo)   MITRAL VALVE REPLACEMENT  1998   PACEMAKER INSERTION  07/07/2018   TRICUSPID VALVE SURGERY  11/2013   repair   VASCULAR SURGERY Left    had repair of aneurysm left arm following removal of central line    Current  Medications: Current Meds  Medication Sig   carvedilol (COREG) 6.25 MG tablet Take 1 tablet (6.25 mg total) by mouth 2 (two) times daily with a meal.   Cholecalciferol 5000 units TABS Take 5,000 Units by mouth daily.   cholestyramine (QUESTRAN) 4 g packet Take 1 packet (4 g total) by mouth 3 (three) times daily with meals. (Patient taking differently: Take 4 g by mouth as needed (IBS).)   ezetimibe (ZETIA) 10 MG tablet Take 1 tablet (10 mg total) by mouth daily.   furosemide (LASIX) 40 MG tablet Take 1 tablet (40 mg total) by mouth daily.   levothyroxine (SYNTHROID) 200 MCG tablet Take 1 tablet (200 mcg total) by mouth daily.   levothyroxine (SYNTHROID) 25 MCG tablet Take 1 tablet (25 mcg total) by mouth daily.   lisinopril (ZESTRIL) 2.5 MG tablet Take 1 tablet (2.5 mg total) by mouth daily.   Multiple Vitamins-Minerals (PRESERVISION AREDS PO) Take 1 tablet by mouth 2 (two) times daily. Unknown strength   traZODone (DESYREL) 100 MG tablet Take 1 tablet (100 mg total) by mouth at bedtime.   warfarin (COUMADIN) 5 MG tablet Take as directed by mouth (Patient taking differently: Take 5 mg by mouth one time only at 4 PM. Take as directed by  mouth)     Allergies:   Entresto [sacubitril-valsartan] and Losartan   Social History   Socioeconomic History   Marital status: Widowed    Spouse name: Not on file   Number of children: Not on file   Years of education: Not on file   Highest education level: Not on file  Occupational History   Not on file  Tobacco Use   Smoking status: Never   Smokeless tobacco: Never  Vaping Use   Vaping Use: Never used  Substance and Sexual Activity   Alcohol use: Yes    Comment: rare alcohol   Drug use: No   Sexual activity: Not Currently  Other Topics Concern   Not on file  Social History Narrative   Husband died in 04-24-19   Retired respiratory therapist   One daughter died at age 75   One living son, lives with im.   She belongs to the [employer] fr[employer]  organization   No pets   Social Determinants of Health   Financial Resource Strain: Not on file  Food Insecurity: Not on file  Transportation Needs: Not on file  Physical Activity: Not on file  Stress: Not on file  Social Connections: Not on file     Family History: The patient's family history includes Breast cancer in her mother; Diabetes Mellitus I (age of onset: 29) in her daughter; Hyperlipidemia in her sister. ROS:   Please see the history of present illness.    All 14 point review of systems negative except as described per history of present illness  EKGs/Labs/Other Studies Reviewed:      Recent Labs: 04/17/2020: NT-Pro BNP 9,430 06/10/2020: ALT 14; B Natriuretic Peptide 707.8; Hemoglobin 11.3; Platelets 90 09/20/2020: TSH 1.65 11/27/2020: BUN 23; Creatinine, Ser 1.43; Potassium 4.9; Sodium 141  Recent Lipid Panel    Component Value Date/Time   CHOL 148 02/19/2020 1104   TRIG 103.0 02/19/2020 1104   HDL 44.60 02/19/2020 1104   CHOLHDL 3 02/19/2020 1104   VLDL 20.6 02/19/2020 1104   LDLCALC 83 02/19/2020 1104    Physical Exam:    VS:  BP 134/82 (BP Location: Left Arm, Patient Position: Sitting)    Pulse 75    Ht 5' 7"  (1.702 m)    Wt 167 lb (75.8 kg)    SpO2 96%    BMI 26.16 kg/m     Wt Readings from Last 3 Encounters:  04/14/21 167 lb (75.8 kg)  01/07/21 165 lb (74.8 kg)  11/27/20 170 lb (77.1 kg)     GEN:  Well nourished, well developed in no acute distress HEENT: Normal NECK: No JVD; No carotid bruits LYMPHATICS: No lymphadenopathy CARDIAC: RRR, soft systolic murmur grade 1/6 best at left border of the sternum no rubs, no gallops RESPIRATORY:  Clear to auscultation without rales, wheezing or rhonchi  ABDOMEN: Soft, non-tender, non-distended MUSCULOSKELETAL:  No edema; No deformity  SKIN: Warm and dry LOWER EXTREMITIES: no swelling NEUROLOGIC:  Alert and oriented x 3 PSYCHIATRIC:  Normal affect   ASSESSMENT:    1. Atrial fibrillation, chronic ([place])    2. Essential hypertension   3. Pulmonary hypertension ([place])   4. Primary hypertension   5. Status post mitral valve replacement with bioprosthetic valve   6. Mixed hyperlipidemia    PLAN:    In order of problems listed above:  Atrial fibrillation which is permanent.  She is anticoagulant Coumadin.  However she has not been checked for years in terms of INR.  I  suggest that potentially switch to Eliquis however for now she prefers to go on Coumadin in the future we may consider switching. Essential hypertension: Blood pressure slightly on the higher side. History of pulmonary hypertension.  We will schedule her to have echocardiogram done History of cardiomyopathy, I did have difficulty putting her on appropriate medications.  Today is slightly higher we will repeat her echocardiogram to check for left ventricle ejection fraction, does have mildly elevated creatinine.  We will recheck the test. I will do routine test which include CBC complete metabolic panel TSH.  We will try to look for some doctor in place that she believes to help her with that.   Medication Adjustments/Labs and Tests Ordered: Current medicines are reviewed at length with the patient today.  Concerns regarding medicines are outlined above.  No orders of the defined types were placed in this encounter.  Medication changes: No orders of the defined types were placed in this encounter.   Signed, Park Liter, MD, [facility] 04/14/2021 1:58 PM    [place]

## 2021-04-15 LAB — CUP PACEART REMOTE DEVICE CHECK
Battery Remaining Longevity: 109 mo
Battery Voltage: 3.04 V
Brady Statistic RV Percent Paced: 93.35 %
Date Time Interrogation Session: 20230303000052
Implantable Lead Implant Date: 19980810
Implantable Lead Location: 753860
Implantable Lead Model: 5054
Implantable Pulse Generator Implant Date: 20200528
Lead Channel Impedance Value: 551 Ohm
Lead Channel Impedance Value: 722 Ohm
Lead Channel Sensing Intrinsic Amplitude: 12.625 mV
Lead Channel Sensing Intrinsic Amplitude: 12.625 mV
Lead Channel Setting Pacing Amplitude: 2.75 V
Lead Channel Setting Pacing Pulse Width: 0.6 ms
Lead Channel Setting Sensing Sensitivity: 1.2 mV

## 2021-04-15 LAB — COMPREHENSIVE METABOLIC PANEL
ALT: 11 IU/L (ref 0–32)
AST: 16 IU/L (ref 0–40)
Albumin/Globulin Ratio: 2.5 — ABNORMAL HIGH (ref 1.2–2.2)
Albumin: 4.3 g/dL (ref 3.6–4.6)
Alkaline Phosphatase: 83 IU/L (ref 44–121)
BUN/Creatinine Ratio: 18 (ref 12–28)
BUN: 24 mg/dL (ref 8–27)
Bilirubin Total: 0.3 mg/dL (ref 0.0–1.2)
CO2: 19 mmol/L — ABNORMAL LOW (ref 20–29)
Calcium: 9 mg/dL (ref 8.7–10.3)
Chloride: 108 mmol/L — ABNORMAL HIGH (ref 96–106)
Creatinine, Ser: 1.33 mg/dL — ABNORMAL HIGH (ref 0.57–1.00)
Globulin, Total: 1.7 g/dL (ref 1.5–4.5)
Glucose: 82 mg/dL (ref 70–99)
Potassium: 4.9 mmol/L (ref 3.5–5.2)
Sodium: 139 mmol/L (ref 134–144)
Total Protein: 6 g/dL (ref 6.0–8.5)
eGFR: 40 mL/min/{1.73_m2} — ABNORMAL LOW (ref >59)

## 2021-04-15 LAB — CBC
Hematocrit: 34 % (ref 34.0–46.6)
Hemoglobin: 10.7 g/dL — ABNORMAL LOW (ref 11.1–15.9)
MCH: 28.7 pg (ref 26.6–33.0)
MCHC: 31.5 g/dL (ref 31.5–35.7)
MCV: 91 fL (ref 79–97)
Platelets: 82 10*3/uL — CL (ref 150–450)
RBC: 3.73 x10E6/uL — ABNORMAL LOW (ref 3.77–5.28)
RDW: 14.2 % (ref 11.7–15.4)
WBC: 3.7 10*3/uL (ref 3.4–10.8)

## 2021-04-15 LAB — PROTIME-INR
INR: 3.1 — ABNORMAL HIGH (ref 0.9–1.2)
Prothrombin Time: 31.1 s — ABNORMAL HIGH (ref 9.1–12.0)

## 2021-04-15 LAB — LIPID PANEL
Chol/HDL Ratio: 2.7 ratio (ref 0.0–4.4)
Cholesterol, Total: 142 mg/dL (ref 100–199)
HDL: 53 mg/dL (ref >39)
LDL Chol Calc (NIH): 71 mg/dL (ref 0–99)
Triglycerides: 100 mg/dL (ref 0–149)
VLDL Cholesterol Cal: 18 mg/dL (ref 5–40)

## 2021-04-15 LAB — TSH: TSH: 17.6 u[IU]/mL — ABNORMAL HIGH (ref 0.450–4.500)

## 2021-04-22 ENCOUNTER — Ambulatory Visit (HOSPITAL_BASED_OUTPATIENT_CLINIC_OR_DEPARTMENT_OTHER)
Admission: RE | Admit: 2021-04-22 | Discharge: 2021-04-22 | Disposition: A | Discharge status: Home / Self Care | Payer: Medicare Other | Source: Ambulatory Visit | Attending: Cardiology | Admitting: Cardiology

## 2021-04-22 ENCOUNTER — Other Ambulatory Visit: Payer: Self-pay

## 2021-04-22 DIAGNOSIS — I272 Pulmonary hypertension, unspecified: Secondary | ICD-10-CM | POA: Diagnosis present

## 2021-04-22 DIAGNOSIS — I482 Chronic atrial fibrillation, unspecified: Secondary | ICD-10-CM | POA: Diagnosis present

## 2021-04-22 DIAGNOSIS — E782 Mixed hyperlipidemia: Secondary | ICD-10-CM

## 2021-04-22 DIAGNOSIS — R0609 Other forms of dyspnea: Secondary | ICD-10-CM

## 2021-04-22 DIAGNOSIS — Z953 Presence of xenogenic heart valve: Secondary | ICD-10-CM

## 2021-04-22 DIAGNOSIS — I10 Essential (primary) hypertension: Secondary | ICD-10-CM | POA: Diagnosis present

## 2021-04-22 NOTE — Progress Notes (Signed)
?  Echocardiogram ?2D Echocardiogram has been performed. ? ?Annette Powers ?04/22/2021, 11:58 AM ?

## 2021-04-23 LAB — ECHOCARDIOGRAM COMPLETE
AR max vel: 1.34 cm2
AV Area VTI: 1.32 cm2
AV Area mean vel: 1.43 cm2
AV Mean grad: 6 mmHg
AV Peak grad: 11.8 mmHg
Ao pk vel: 1.72 m/s
Calc EF: 36.7 %
MV VTI: 0.67 cm2
S' Lateral: 6 cm
Single Plane A2C EF: 44.3 %
Single Plane A4C EF: 30.8 %

## 2021-04-28 NOTE — Progress Notes (Signed)
Remote pacemaker transmission.   

## 2021-05-01 ENCOUNTER — Telehealth: Payer: Self-pay

## 2021-05-01 ENCOUNTER — Telehealth: Payer: Self-pay | Admitting: Cardiology

## 2021-05-01 MED ORDER — CARVEDILOL 6.25 MG PO TABS: 6.2500 mg | ORAL_TABLET | Freq: Two times a day (BID) | ORAL | 3 refills | Status: AC

## 2021-05-01 MED ORDER — EZETIMIBE 10 MG PO TABS
10.0000 mg | ORAL_TABLET | Freq: Every day | ORAL | 3 refills | Status: AC
Start: 2021-05-01 — End: ?

## 2021-05-01 NOTE — Telephone Encounter (Signed)
The patient has been notified of the result (lab work and echocardiogram)  and verbalized understanding.  All questions (if any) were answered. ?Precious Gilding, RN 05/01/2021 12:12 PM   ?Pt reports has an upcoming OV with a new Internist on May 12, 2021 can not remember providers name.  Pt reports takes levothyroxine 225 mcg PO QD.  Also reports only has 3 days of carvedilol and ezetimibe left.  Will route to MD to address.   ?

## 2021-05-01 NOTE — Telephone Encounter (Signed)
Patient was returning call for results. Please advise °

## 2021-05-01 NOTE — Telephone Encounter (Signed)
-----   Message from Park Liter, MD sent at 04/16/2021 11:56 AM EST ----- ?Laboratory test show few abnormalities probably the most concerning is hypothyroidism.  She need to find out who can be primary care physician taking care of her, she is supposed to find somebody in place she leaves which is [facility] Coder, please let me know if she finds anybody better if she did and there is a long waiting time we can call them and asked them to see her sooner also when you talk to her please ask her how much of Synthroid is she taking.  INR was therapeutic I will ?

## 2021-05-01 NOTE — Telephone Encounter (Signed)
Called pt left a detailed message informing that carvedilol 6.25 mg PO BID and ezetimibe 10 mg PO QD will be refilled.  Advised if have questions or concerns to call the office.   ?

## 2021-05-01 NOTE — Telephone Encounter (Signed)
Patient has been notified of result by one of our nurses. ?

## 2021-07-03 ENCOUNTER — Telehealth: Payer: Self-pay | Admitting: Cardiology

## 2021-07-03 NOTE — Telephone Encounter (Signed)
Patient called stating she has found a cardiologist closer to where she lives that she is continuing her care with. She is requesting all future appointments including those with her monitor be cancelled due to this.

## 2021-07-03 NOTE — Telephone Encounter (Signed)
Spoke with pt pacemaker following with [facility] in [place] Dr. Lars Masson per patient this practive does not do remotes will be DC in in [place] and [facility] in [place]

## 2021-07-10 ENCOUNTER — Telehealth: Payer: Self-pay | Admitting: Family

## 2021-07-10 NOTE — Telephone Encounter (Signed)
Left message for patient to call back and schedule Medicare Annual Wellness Visit (AWV).   Please offer to do virtually or by telephone.  Left office number and my jabber [phone].  AWVI eligible as of  08/10/2019   Please schedule at anytime with Nurse Health Advisor.

## 2021-07-14 ENCOUNTER — Encounter

## 2021-09-22 ENCOUNTER — Ambulatory Visit: Payer: Medicare Other | Admitting: Cardiology

## 2021-11-14 ENCOUNTER — Telehealth: Payer: Self-pay | Admitting: Family

## 2021-11-14 NOTE — Telephone Encounter (Signed)
I called patient to schedule her AWV.  Patient said she's seeing a new PCP. She said she lives 40 miles from the office and she wanted to find a provider closer to her home.  Patient said she's seeing a nurse practitioner who does family practice at [facility] in [place]. [place].  Please remove PCP.

## 2022-01-12 ENCOUNTER — Encounter

## 2022-06-27 IMAGING — NM NM DATSCAN
2 series · 12 of 12 positions shown · non-contrast
Comparison: None.

CLINICAL DATA: LEFT hand tremors for 4 years.  85-year-old female.

EXAM:
NUCLEAR MEDICINE BRAIN IMAGING WITH SPECT  (DaTscan )
TECHNIQUE: SPECT images of the brain were obtained after intravenous injection
of radiopharmaceutical. 4 hour post injection imaging. Appropriate
positioning. 130 mg i-STAT given orally for thyroid blockade.
RADIOPHARMACEUTICALS:  4.2 millicuries I 123 Ioflupane

[Series 1: spect - 159 kev _(id)_cor · 4.1mm · 4.14mm/px · 6 of 128 frames shown]
[frame 11/128]
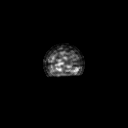
[frame 32/128]
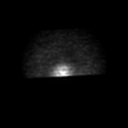
[frame 54/128]
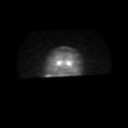
[frame 75/128]
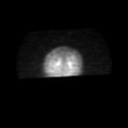
[frame 96/128]
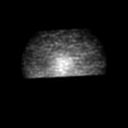
[frame 118/128]
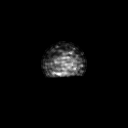

[Series 1: spect - 159 kev _(id)_tra · 4.1mm · 4.14mm/px · 6 of 128 frames shown]
[frame 11/128]
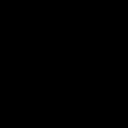
[frame 32/128]
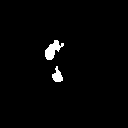
[frame 54/128]
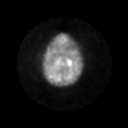
[frame 75/128]
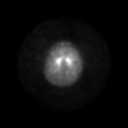
[frame 96/128]
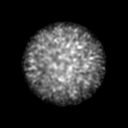
[frame 118/128]
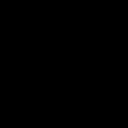

[12 of 12 positions shown; findings below may reference images not displayed]

FINDINGS: The RIGHT basal ganglia is slightly smaller than LEFT; however,
there is intense activity in the head of RIGHT caudate nucleus and
posterior RIGHT striatum. The LEFT striatum has normal activity
anteriorly and posteriorly.
IMPRESSION: While there is asymmetry of the striata with the RIGHT smaller than
the LEFT, this finding is favored to be within normal anatomic
variation. No convincing evidence of significant loss of dopamine
transporter populations.

Of note, DaTSCAN is not diagnostic of Parkinsonian syndromes, which
remains a clinical diagnosis. DaTscan is an adjuvant test to aid in
the clinical diagnosis of Parkinsonian syndromes.

## 2022-07-17 IMAGING — DX DG CHEST 2V
2 series · 2 of 2 positions shown · non-contrast
Comparison: None.

CLINICAL DATA: Dyspnea on exertion for

EXAM:
CHEST - 2 VIEW

[chest pa]
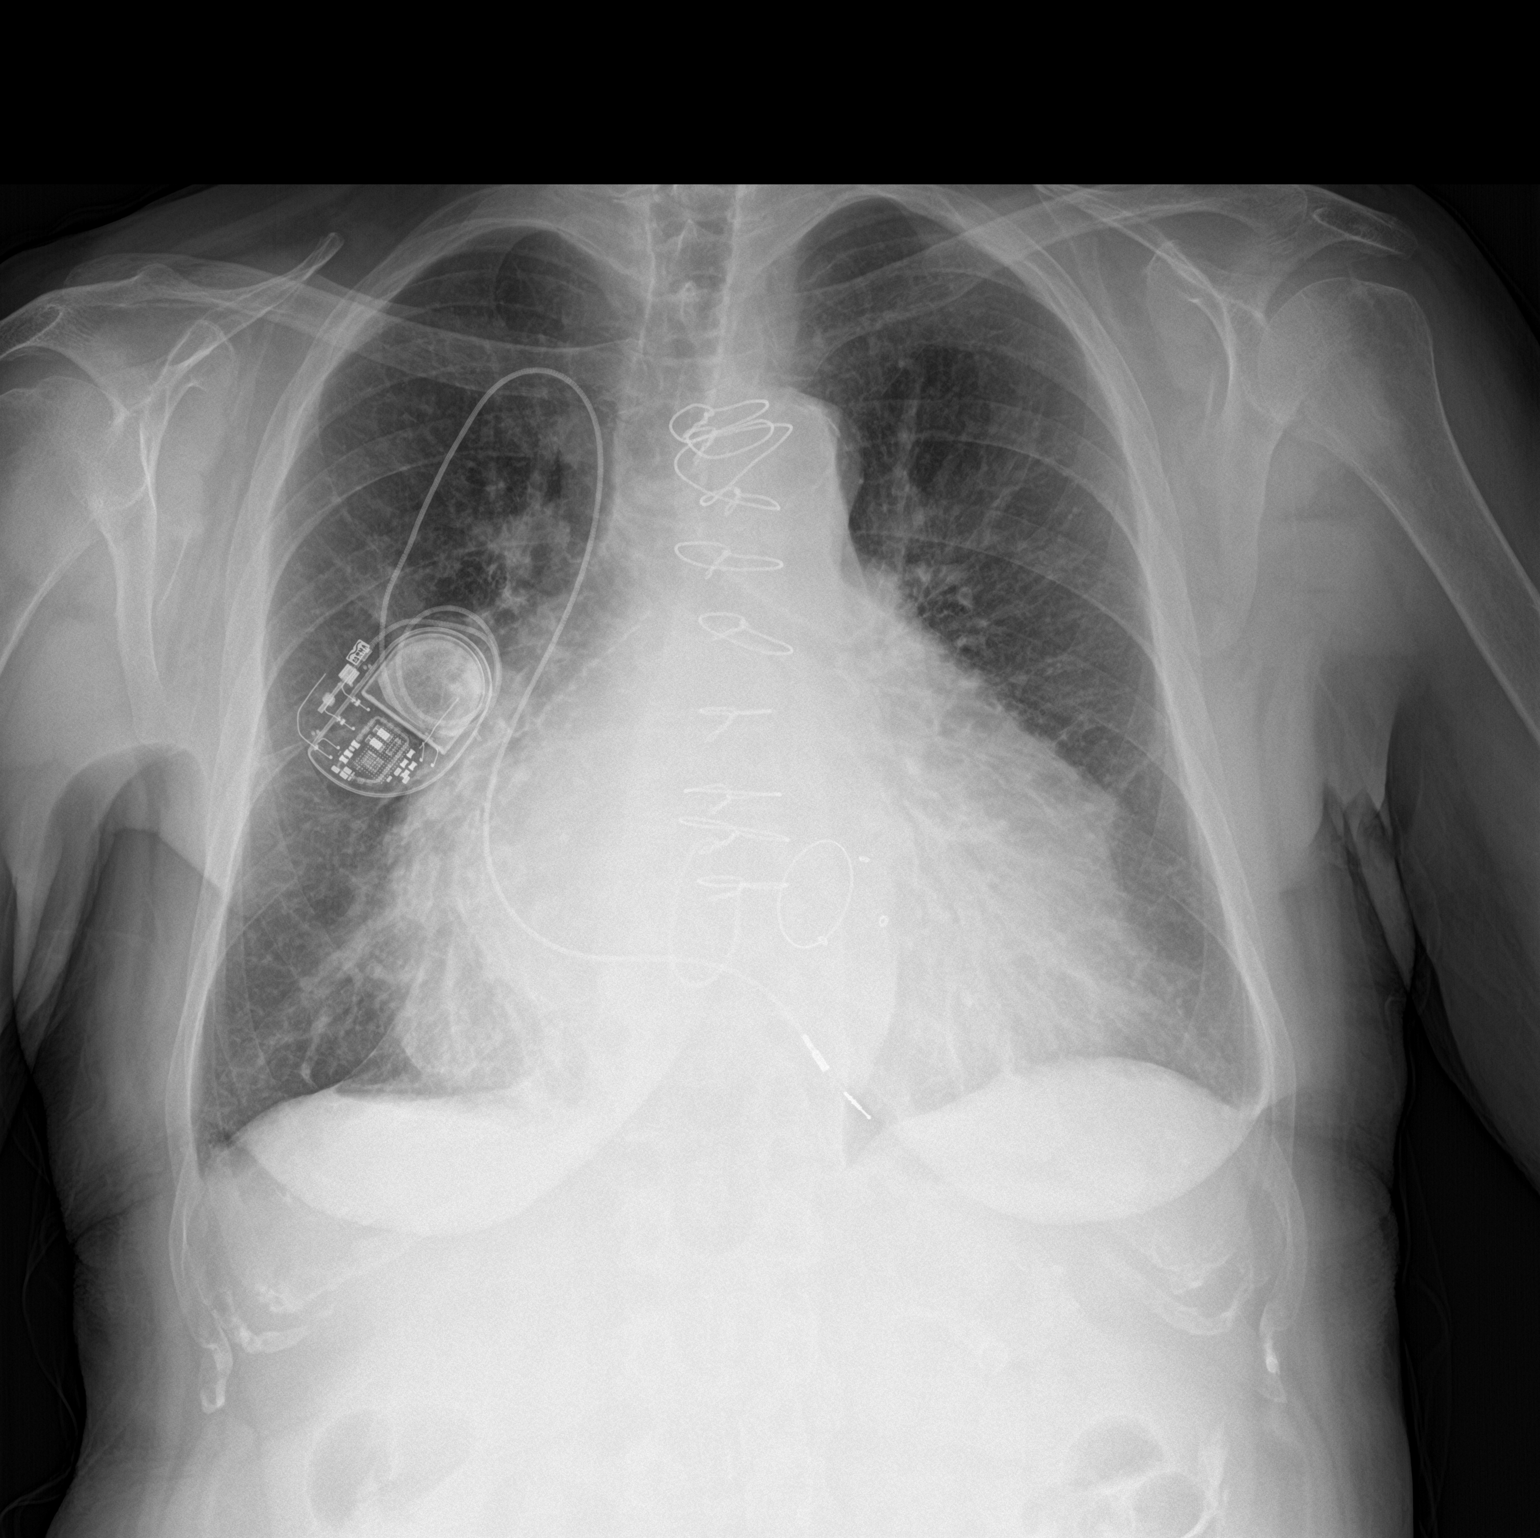

[chest lat]
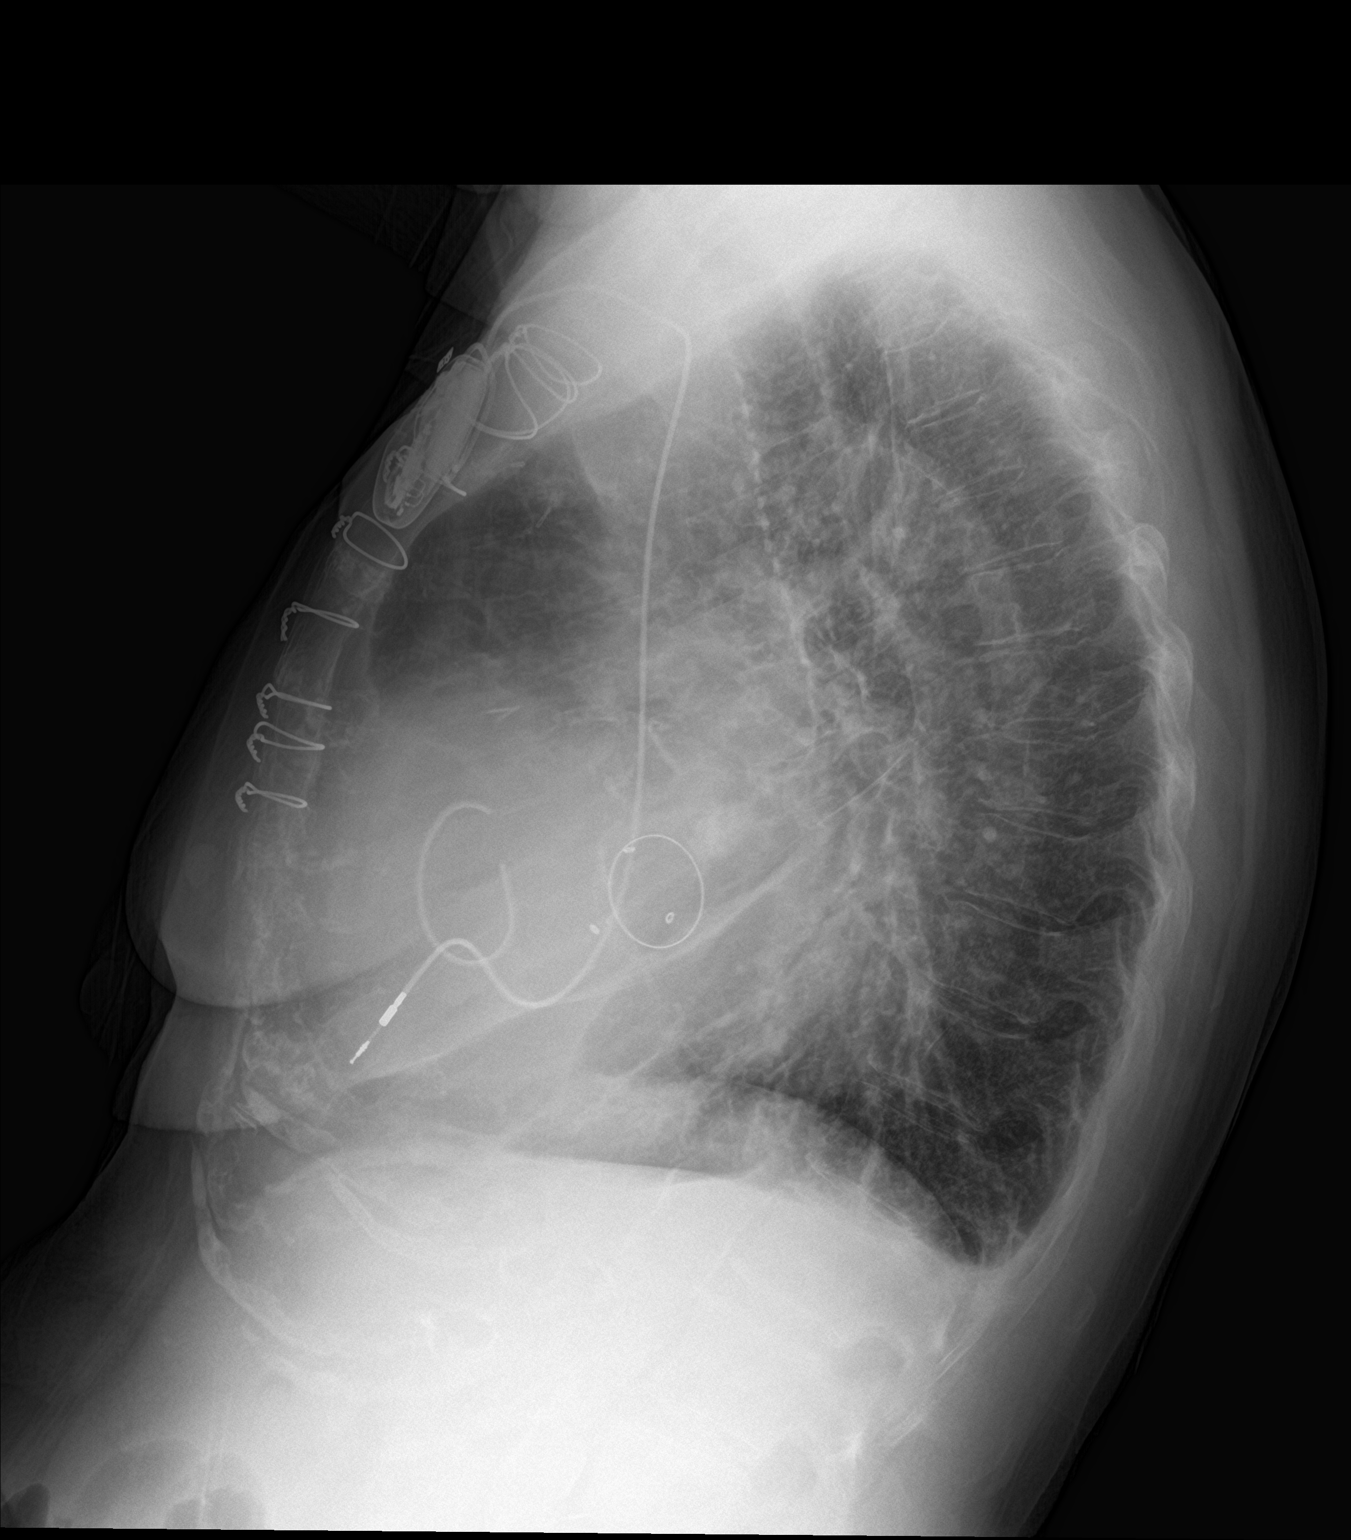

[2 of 2 positions shown; findings below may reference images not displayed]

FINDINGS: Cardiac shadow is enlarged. Postsurgical changes are seen. Pacing
device is noted. The lungs are well aerated bilaterally with mild
vascular congestion. No significant interstitial edema is noted.
Minimal pleural fluid is noted bilaterally.
IMPRESSION: Changes consistent with mild CHF. No significant edema is noted at
this time.
